# Patient Record
Sex: Male | Born: 1955 | Race: White | Hispanic: No | Marital: Single | Smoking: Never smoker
Health system: Southern US, Community
[De-identification: ages and names within clinical notes are randomized; demographics above are authoritative.]

## PROBLEM LIST (undated history)

## (undated) DIAGNOSIS — E7800 Pure hypercholesterolemia, unspecified: Secondary | ICD-10-CM

## (undated) DIAGNOSIS — I10 Essential (primary) hypertension: Secondary | ICD-10-CM

## (undated) DIAGNOSIS — E119 Type 2 diabetes mellitus without complications: Secondary | ICD-10-CM

## (undated) HISTORY — DX: Type 2 diabetes mellitus without complications: E11.9

## (undated) HISTORY — DX: Pure hypercholesterolemia, unspecified: E78.00

## (undated) HISTORY — DX: Essential (primary) hypertension: I10

---
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
## 1991-09-07 HISTORY — PX: ROTATOR CUFF REPAIR: SHX139

## 2008-10-11 ENCOUNTER — Encounter: Admission: RE | Admit: 2008-10-11 | Discharge: 2008-10-11 | Payer: Self-pay | Admitting: Internal Medicine

## 2009-11-27 ENCOUNTER — Ambulatory Visit: Payer: Self-pay | Admitting: Family Medicine

## 2009-11-27 ENCOUNTER — Ambulatory Visit (HOSPITAL_COMMUNITY): Admission: RE | Admit: 2009-11-27 | Discharge: 2009-11-27 | Payer: Self-pay | Admitting: Family Medicine

## 2009-11-27 DIAGNOSIS — E119 Type 2 diabetes mellitus without complications: Secondary | ICD-10-CM | POA: Insufficient documentation

## 2009-11-27 DIAGNOSIS — R079 Chest pain, unspecified: Secondary | ICD-10-CM | POA: Insufficient documentation

## 2009-11-27 DIAGNOSIS — I10 Essential (primary) hypertension: Secondary | ICD-10-CM | POA: Insufficient documentation

## 2009-11-27 DIAGNOSIS — E7800 Pure hypercholesterolemia, unspecified: Secondary | ICD-10-CM | POA: Insufficient documentation

## 2009-11-27 DIAGNOSIS — I1A0 Resistant hypertension: Secondary | ICD-10-CM | POA: Insufficient documentation

## 2009-11-27 HISTORY — DX: Essential (primary) hypertension: I10

## 2009-11-27 HISTORY — DX: Type 2 diabetes mellitus without complications: E11.9

## 2009-11-27 HISTORY — DX: Pure hypercholesterolemia, unspecified: E78.00

## 2009-12-01 ENCOUNTER — Encounter: Payer: Self-pay | Admitting: Family Medicine

## 2009-12-01 ENCOUNTER — Ambulatory Visit: Payer: Self-pay | Admitting: Family Medicine

## 2009-12-01 LAB — CONVERTED CEMR LAB
Albumin/Creatinine Ratio, Urine, POC: >300
Albumin: 4.8 g/dL (ref 3.5–5.2)
BUN: 16 mg/dL (ref 6–23)
CO2: 24 meq/L (ref 19–32)
Cholesterol: 144 mg/dL (ref 0–200)
Creatinine,U: 200 mg/dL
Glucose, Bld: 93 mg/dL (ref 70–99)
HDL: 36 mg/dL — ABNORMAL LOW (ref >39)
Hgb A1c MFr Bld: 5.3 %
Microalbumin U total vol: 150 mg/L
Potassium: 3.7 meq/L (ref 3.5–5.3)
Sodium: 141 meq/L (ref 135–145)
Total Bilirubin: 0.4 mg/dL (ref 0.3–1.2)
Total Protein: 7.6 g/dL (ref 6.0–8.3)
Triglycerides: 164 mg/dL — ABNORMAL HIGH (ref <150)

## 2009-12-02 ENCOUNTER — Encounter: Payer: Self-pay | Admitting: Family Medicine

## 2009-12-17 ENCOUNTER — Encounter: Payer: Self-pay | Admitting: Family Medicine

## 2009-12-19 ENCOUNTER — Encounter: Payer: Self-pay | Admitting: Family Medicine

## 2010-06-18 ENCOUNTER — Telehealth: Payer: Self-pay | Admitting: Family Medicine

## 2010-06-29 ENCOUNTER — Telehealth: Payer: Self-pay | Admitting: Family Medicine

## 2010-06-30 ENCOUNTER — Ambulatory Visit: Payer: Self-pay | Admitting: Family Medicine

## 2010-07-08 ENCOUNTER — Ambulatory Visit: Payer: Self-pay | Admitting: Family Medicine

## 2010-07-08 DIAGNOSIS — T148XXA Other injury of unspecified body region, initial encounter: Secondary | ICD-10-CM | POA: Insufficient documentation

## 2010-10-08 NOTE — Miscellaneous (Signed)
Summary: Re: cardiology appointment  Clinical Lists Changes   called patient to notifiy about cardiology appointment that has been scheduled for May 10 at 11:45 AM. with Dr. Shirlee Latch of [facility]  . he would like to know results of recent labs . cell phone # [phone]. will forward message to MD. Theresia Lo RN  December 17, 2009 4:34 PM   Called patient. Spoke with himn about labs. Will also send letter. Bobby Rumpf  MD  December 19, 2009 1:56 PM

## 2010-10-08 NOTE — Assessment & Plan Note (Signed)
Summary: leg swelling,df   Vital Signs:  Patient profile:   55 year old male Height:      69.5 inches Weight:      235 pounds BMI:     34.33 Temp:     98.6 degrees F oral Pulse rate:   90 / minute BP sitting:   168 / 92  (left arm) Cuff size:   large  Vitals Entered By: Tessie Fass CMA (June 30, 2010 2:31 PM) CC: right leg edema and pain Is Patient Diabetic? Yes Pain Assessment Patient in pain? yes     Location: right leg Intensity: 6   Primary Care Provider:  Bobby Rumpf   CC:  right leg edema and pain.  History of Present Illness: 55 yo M:  Mr. Speigner presents with right lower extremity redness and swelling that has gotten worse over the past 5 days.  About 10 days ago he stated he was chasing his dog and hit his right lower extremity against a table and noticed some swelling just medial and inferior to the tibial tubercle.  The pain is currently a 5-6/10 and does not radiate anywhere else.  A few days after the injury he noticed his leg had increased swelling, redness, and pain so he tried icing the injury site.  He also has used some Tylenol pain cream, which he says helps for about an hour or so.  He denies any skin breakage, bleeding, or other discharge.  He states that some of the redness may be due to icing the area for an extended period of time.  Has never had anything like this before.  He complains of some persistent swelling in bilateral lower extremities that he says is normal for him.  The right leg seems more swollen than usual. The patient denies fevers, chills, nausea, vomiting, dizziness, chest pain, palpitations, shortness of breath, or abdominal pain.  Habits & Providers  Alcohol-Tobacco-Diet     Tobacco Status: never  Current Medications (verified): 1)  Aspirin 81 Mg Tbec (Aspirin) .... One Tab By Mouth Qday 2)  Vesicare 10 Mg Tabs (Solifenacin Succinate) .... 1/2 Tab Po Qday 3)  Glimepiride 2 Mg Tabs (Glimepiride) .... One Tab By Mouth Qday 4)   Amlodipine Besylate 10 Mg Tabs (Amlodipine Besylate) .... One Tab By Mouth Qday 5)  Lisinopril 40 Mg Tabs (Lisinopril) .... One Tab By Mouth Qday 6)  Metoprolol Tartrate 100 Mg Tabs (Metoprolol Tartrate) .... One Tab By Mouth Two Times A Day 7)  Actoplus Met 15-850 Mg Tabs (Pioglitazone Hcl-Metformin Hcl) .... One Tab By Mouth Two Times A Day 8)  Pravastatin Sodium 40 Mg Tabs (Pravastatin Sodium) .... One Tab By Mouth At Bedtime  Allergies (verified): No Known Drug Allergies PMH-FH-SH reviewed for relevance  Review of Systems      See HPI  Physical Exam  General:  Well-developed, well-nourished, in no acute distress; alert, appropriate and cooperative throughout examination. Vitals reviewed. Lungs:  Normal respiratory effort, chest expands symmetrically. Lungs are clear to auscultation, no crackles or wheezes. Heart:  RRR no murmurs. Msk:  Neg Homan's. Normal ankle ROM - right. Pulses:  2+ DP. Extremities:  Trace left pedal edema and trace right pedal edema.   Skin:  Right lower anterior leg with  ~ 6 inches length erythema, warmth. No draining lesions. Healing abrasion.    Impression & Recommendations:  Problem # 1:  CELLULITIS, LEG, RIGHT (ICD-682.6) Assessment New  No Red Flags. Not suspicious for DVT. Follow-up for recheck in 1  week if not impoving. His updated medication list for this problem includes:    Septra Ds 800-160 Mg Tabs (Sulfamethoxazole-trimethoprim) .Marland Kitchen... 2 by mouth twice daily x 14 days  Orders: [facility]- Est  Level 4 ([address]644)  Problem # 2:  HYPERTENSION (ICD-401.9) Assessment: Unchanged  Recheck at next visit. Use Tylenol over NSAIDs as needed for pain. His updated medication list for this problem includes:    Amlodipine Besylate 10 Mg Tabs (Amlodipine besylate) ..... One tab by mouth qday    Lisinopril 40 Mg Tabs (Lisinopril) ..... One tab by mouth qday    Metoprolol Tartrate 100 Mg Tabs (Metoprolol tartrate) ..... One tab by mouth two times a  day  Orders: [facility]- Est  Level 4 (03474)  Complete Medication List: 1)  Aspirin 81 Mg Tbec (Aspirin) .... One tab by mouth qday 2)  Vesicare 10 Mg Tabs (Solifenacin succinate) .... 1/2 tab po qday 3)  Glimepiride 2 Mg Tabs (Glimepiride) .... One tab by mouth qday 4)  Amlodipine Besylate 10 Mg Tabs (Amlodipine besylate) .... One tab by mouth qday 5)  Lisinopril 40 Mg Tabs (Lisinopril) .... One tab by mouth qday 6)  Metoprolol Tartrate 100 Mg Tabs (Metoprolol tartrate) .... One tab by mouth two times a day 7)  Actoplus Met 15-850 Mg Tabs (Pioglitazone hcl-metformin hcl) .... One tab by mouth two times a day 8)  Pravastatin Sodium 40 Mg Tabs (Pravastatin sodium) .... One tab by mouth at bedtime 9)  Septra Ds 800-160 Mg Tabs (Sulfamethoxazole-trimethoprim) .... 2 by mouth twice daily x 14 days  Patient Instructions: 1)  It was nice to meet you today! 2)  You have an infection of your skin. It does not look like a blood clot. 3)  Come back by the end of this week for a recheck. Prescriptions: SEPTRA DS 800-160 MG TABS (SULFAMETHOXAZOLE-TRIMETHOPRIM) 2 by mouth twice daily x 14 days  #1 qs x 0   Entered and Authorized by:   Helane Rima DO   Signed by:   Helane Rima DO on 06/30/2010   Method used:   Electronically to        [facility] [address] (retail)       [address]       [place], [place]  [address]       Ph: [phone] or [phone]       Fax: [phone]   RxID:   [id number]    Orders Added: 1)  [facility]- Est  Level 4 [[address]]

## 2010-10-08 NOTE — Progress Notes (Signed)
  Phone Note Call from Patient   Caller: Patient Call For: [phone] Summary of Call: C/o of rt leg swelling.  Need to be seen sooner than 11/14 which is providers next available clinic Initial call taken by: Abundio Miu,  June 29, 2010 2:09 PM  Follow-up for Phone Call        Please call pt back and offer him a [place] appt tomorrow morning or Wednesday. Follow-up by: Dennison Nancy RN,  June 29, 2010 2:11 PM

## 2010-10-08 NOTE — Assessment & Plan Note (Signed)
Summary: F/U  BP/CAREW NOT AVAIL/KH   Vital Signs:  Patient profile:   55 year old male Height:      69.5 inches Weight:      233 pounds BMI:     34.04 Temp:     98.1 degrees F oral Pulse rate:   77 / minute BP sitting:   145 / 79  (left arm) Cuff size:   large  Vitals Entered By: Tessie Fass CMA (July 08, 2010 8:40 AM) CC: F/U  Is Patient Diabetic? Yes   Primary Care Provider:  Bobby Rumpf   CC:  F/U .  History of Present Illness: 55 yo M:  1. Cellulits: Right lower leg. Hx of injury: Patient stated he was chasing his dog and hit his right lower extremity against a table and noticed some swelling just medial and inferior to the tibial tubercle. A few days after the injury he noticed his leg had increased swelling, redness, and pain so he tried icing the injury site.  He also has used some Tylenol pain cream, which he says helped for about an hour or so.  He denied any skin breakage, bleeding, or other discharge. He was treated with Septra for cellulitis. He had no RED FLAGs last week. Today, he presents for follow-up of his right cellulitis. The patient denies fevers, chills, nausea, vomiting, dizziness, chest pain, palpitations, shortness of breath, or abdominal pain.  Current Medications (verified): 1)  Aspirin 81 Mg Tbec (Aspirin) .... One Tab By Mouth Qday 2)  Vesicare 10 Mg Tabs (Solifenacin Succinate) .... 1/2 Tab Po Qday 3)  Glimepiride 2 Mg Tabs (Glimepiride) .... One Tab By Mouth Qday 4)  Amlodipine Besylate 10 Mg Tabs (Amlodipine Besylate) .... One Tab By Mouth Qday 5)  Lisinopril 40 Mg Tabs (Lisinopril) .... One Tab By Mouth Qday 6)  Metoprolol Tartrate 100 Mg Tabs (Metoprolol Tartrate) .... One Tab By Mouth Two Times A Day 7)  Actoplus Met 15-850 Mg Tabs (Pioglitazone Hcl-Metformin Hcl) .... One Tab By Mouth Two Times A Day 8)  Pravastatin Sodium 40 Mg Tabs (Pravastatin Sodium) .... One Tab By Mouth At Bedtime 9)  Septra Ds 800-160 Mg Tabs  (Sulfamethoxazole-Trimethoprim) .... 2 By Mouth Twice Daily X 14 Days  Allergies (verified): No Known Drug Allergies PMH-FH-SH reviewed for relevance  Review of Systems      See HPI  Physical Exam  General:  Well-developed, well-nourished, in no acute distress; alert, appropriate and cooperative throughout examination. Vitals reviewed. Pulses:  2+ DP. Extremities:  Trace left pedal edema and trace right pedal edema.   Skin:  Right lower anterior leg with improved,  ~ 4 inches length erythema, with no warmth. No draining lesions. 2-3 cm hematoma.    Impression & Recommendations:  Problem # 1:  CELLULITIS, LEG, RIGHT (ICD-682.6) Assessment Improved  No Red Flags. Finish Abx. Precautions given. His updated medication list for this problem includes:    Septra Ds 800-160 Mg Tabs (Sulfamethoxazole-trimethoprim) .Marland Kitchen... 2 by mouth twice daily x 14 days  Orders: [facility]- Est  Level 4 ([address])  Problem # 2:  HEMATOMA (ICD-924.9) Assessment: New  Residual hematoma from injury - resolving. May apply ice three times a day for 10 minutes. No s/s DVT. Follow up if not improving in 1 week.  Orders: [facility]- Est  Level 4 ([address]454)  Problem # 3:  HYPERTENSION (ICD-401.9) Assessment: Improved  His updated medication list for this problem includes:    Amlodipine Besylate 10 Mg Tabs (Amlodipine besylate) .Marland KitchenMarland KitchenMarland KitchenMarland Kitchen  One tab by mouth qday    Lisinopril 40 Mg Tabs (Lisinopril) ..... One tab by mouth qday    Metoprolol Tartrate 100 Mg Tabs (Metoprolol tartrate) ..... One tab by mouth two times a day  Orders: [facility]- Est  Level 4 ([address]40)  Complete Medication List: 1)  Aspirin 81 Mg Tbec (Aspirin) .... One tab by mouth qday 2)  Vesicare 10 Mg Tabs (Solifenacin succinate) .... 1/2 tab po qday 3)  Glimepiride 2 Mg Tabs (Glimepiride) .... One tab by mouth qday 4)  Amlodipine Besylate 10 Mg Tabs (Amlodipine besylate) .... One tab by mouth qday 5)  Lisinopril 40 Mg Tabs (Lisinopril) .... One tab by mouth qday 6)   Metoprolol Tartrate 100 Mg Tabs (Metoprolol tartrate) .... One tab by mouth two times a day 7)  Actoplus Met 15-850 Mg Tabs (Pioglitazone hcl-metformin hcl) .... One tab by mouth two times a day 8)  Pravastatin Sodium 40 Mg Tabs (Pravastatin sodium) .... One tab by mouth at bedtime 9)  Septra Ds 800-160 Mg Tabs (Sulfamethoxazole-trimethoprim) .... 2 by mouth twice daily x 14 days  Patient Instructions: 1)  It was nice to see you today! 2)  Come back by the end of this week for a recheck.   Orders Added: 1)  [facility]- Est  Level 4 [98119]

## 2010-10-08 NOTE — Assessment & Plan Note (Signed)
Summary: NP,tcb   Vital Signs:  Patient profile:   55 year old male Height:      69.5 inches Weight:      237 pounds BMI:     34.62 BSA:     2.23 Temp:     98.3 degrees F Pulse rate:   77 / minute BP sitting:   173 / 90  Vitals Entered By: Jone Baseman CMA (November 27, 2009 4:06 PM) CC: NEW PATIENT Is Patient Diabetic? No Pain Assessment Patient in pain? no        Primary Care Provider:  Bobby Rumpf   CC:  NEW PATIENT.  History of Present Illness: 55 year old male w/ history of diabetes type 2, HTN, HLD here as a NEW PATIENT.  Previous doctor = Dr. Felipa Eth at [facility]   1) DM2: Last A1C 6.7 one year ago. Fasting CBGs 90-100's. Denies vision change, polyuria, polydispsia, LE neuropathy or ulceration. On medications as below w/o side effects. Walks daily for exercise 30 minutes. Has never had eye exam. Does not check feet daily.   2) HTN: Reports LE edema after work, reports single episode of chest pain as below. Reports that he is taking all medications as below w/o side effects.   3) Chest pain: Patient with single episode of left sided chest pain two weeks ago - felt like someone punched him in the chest, while he was working in yard. Denies nausea, radiation of pain, dizziness, diaphoresis. Pain improved after about 10 minutes with resting. Has never had pain like this before. No episodes since with exertion. Family history of early MI in 1st degree relative (brother with MI at age 37, has "12 stents").    Habits & Providers  Alcohol-Tobacco-Diet     Tobacco Status: never  Current Medications (verified): 1)  Aspirin 81 Mg Tbec (Aspirin) .... One Tab By Mouth Qday 2)  Vesicare 10 Mg Tabs (Solifenacin Succinate) .... 1/2 Tab Po Qday 3)  Glimepiride 2 Mg Tabs (Glimepiride) .... One Tab By Mouth Qday 4)  Amlodipine Besylate 10 Mg Tabs (Amlodipine Besylate) .... One Tab By Mouth Qday 5)  Lisinopril 40 Mg Tabs (Lisinopril) .... One Tab By Mouth Qday 6)   Metoprolol Tartrate 100 Mg Tabs (Metoprolol Tartrate) .... One Tab By Mouth Two Times A Day 7)  Actoplus Met 15-850 Mg Tabs (Pioglitazone Hcl-Metformin Hcl) .... One Tab By Mouth Two Times A Day 8)  Pravastatin Sodium 40 Mg Tabs (Pravastatin Sodium) .... One Tab By Mouth At Bedtime  Allergies (verified): No Known Drug Allergies  Past History:  Past Medical History: DM2 HTN HLD Arthritis   Past Surgical History: R hernia repair 1963 L rotator cuff repair [address])  Family History: Dad - unknown Mom - DM2, HTN Brother (only sibling) - MI at age 9, CAD, HTN   Social History: Lives with mom and dog. Denies tobacco, alcohol, drugs. Smoking Status:  never  Physical Exam  General:  obese male NAD  Eyes:  PERRL, EOMI, normal fundi.  Neck:  no carotid bruits  Chest Wall:  non tender to palpation  Lungs:  CTAB w/o wheeze or crackles  Heart:  RRR no murmurs  Abdomen:  obese, non tender, no abdominal pulsatile mass, no abdominal mass, +BS  Pulses:  2+ radials  Extremities:  no LE edema  Neurologic:  alert & oriented X3, cranial nerves II-XII intact, strength normal in all extremities, sensation intact to light touch, sensation intact to pinprick, and DTRs symmetrical and  normal.    Diabetes Management Exam:    Foot Exam (with socks and/or shoes not present):       Sensory-Pinprick/Light touch:          Left medial foot (L-4): normal          Left dorsal foot (L-5): normal          Left lateral foot (S-1): normal          Right medial foot (L-4): normal          Right dorsal foot (L-5): normal          Right lateral foot (S-1): normal       Sensory-Monofilament:          Left foot: normal          Right foot: normal       Inspection:          Left foot: normal          Right foot: normal       Nails:          Left foot: normal          Right foot: normal   Impression & Recommendations:  Problem # 1:  HYPERTENSION (ICD-401.9) Assessment Unchanged Not at goal.  130/80. Will check BMET, likely start HCTZ pending results. DASH diet, exercise discussed. Will call patient to discuss results and starting HCTZ.    His updated medication list for this problem includes:    Amlodipine Besylate 10 Mg Tabs (Amlodipine besylate) ..... One tab by mouth qday    Lisinopril 40 Mg Tabs (Lisinopril) ..... One tab by mouth qday    Metoprolol Tartrate 100 Mg Tabs (Metoprolol tartrate) ..... One tab by mouth two times a day  BP today: 173/90  Problem # 2:  HYPERCHOLESTEROLEMIA (ICD-272.0) Assessment: Unchanged Will check lipid panel. Continue statin.   His updated medication list for this problem includes:    Pravastatin Sodium 40 Mg Tabs (Pravastatin sodium) ..... One tab by mouth at bedtime  Future Orders: Lipid-[facility] ([id number]) ... 11/26/2010  Problem # 3:  DIABETES MELLITUS, TYPE II (ICD-250.00) Assessment: Unchanged Reports last A1C 6.7 one year ago. Will check A1C CMET urine microalbumin. Cotninue medications as below. Treat HTN as above. Check lipids. Follow up 3 months. DASH diet, exercise discussed as above.   His updated medication list for this problem includes:    Aspirin 81 Mg Tbec (Aspirin) ..... One tab by mouth qday    Glimepiride 2 Mg Tabs (Glimepiride) ..... One tab by mouth qday    Lisinopril 40 Mg Tabs (Lisinopril) ..... One tab by mouth qday    Actoplus Met 15-850 Mg Tabs (Pioglitazone hcl-metformin hcl) ..... One tab by mouth two times a day  Future Orders: Comp Met-[facility] ([id number]) ... 11/28/2010 UA Microalbumin-[facility] ([id number]) ... 11/28/2010 A1C-[facility] ([address]86) ... 11/28/2010  Problem # 4:  CHEST PAIN, ATYPICAL (ICD-786.59) Assessment: New Atypical location though brough on by exertion and relieved by rest. No episodes since. Will refer to cardiology for stress testing. EKG w/ NSR, normal intervals, non specific T wave changes.   Orders: 12 Lead EKG (12 Lead EKG)  Complete Medication List: 1)  Aspirin 81 Mg Tbec (Aspirin) .... One  tab by mouth qday 2)  Vesicare 10 Mg Tabs (Solifenacin succinate) .... 1/2 tab po qday 3)  Glimepiride 2 Mg Tabs (Glimepiride) .... One tab by mouth qday 4)  Amlodipine Besylate 10 Mg Tabs (Amlodipine besylate) .... One tab by mouth qday  5)  Lisinopril 40 Mg Tabs (Lisinopril) .... One tab by mouth qday 6)  Metoprolol Tartrate 100 Mg Tabs (Metoprolol tartrate) .... One tab by mouth two times a day 7)  Actoplus Met 15-850 Mg Tabs (Pioglitazone hcl-metformin hcl) .... One tab by mouth two times a day 8)  Pravastatin Sodium 40 Mg Tabs (Pravastatin sodium) .... One tab by mouth at bedtime  Patient Instructions: 1)  I will refer you to cardiology given your chest pain and your risk factors. 2)  Continue to check your BPs and your blood sugars as before. 3)  Follow up in three months.  Appended Document: Orders Update    Clinical Lists Changes  Orders: Added new Referral order of Cardiology Referral (Cardiology) - Signed

## 2010-10-08 NOTE — Letter (Signed)
Summary: Results Follow-up Letter  [facility]  [address]   [place], [place] [address]   Phone: [phone]  Fax: [phone]    12/19/2009  [address] [place], [place]  [address]  Dear Mr. Scism,   The following are the results of your recent test(s):  A1C = 5.3  Your goal is less than: 7  ____________________  LDL(Bad cholesterol): 75          Your goal is less than: 70    ___________________________     HDL (Good cholesterol): 36       Your goal is more than: 40 _________________________________________________________  Your tests of liver function and kidney function were within normal limits    Sincerely,  Bobby Rumpf  MD [facility]

## 2010-10-08 NOTE — Progress Notes (Signed)
  Phone Note Refill Request   Refills Requested: Medication #1:  LISINOPRIL 40 MG TABS one tab by mouth qday Pt received incorrect rx that was requested.  He received the one for the Metoprolol instead.  He need the Lisinopril refilled.  Please fax rx to [facility] pharmacy on [address]  [phone]  Initial call taken by: Abundio Miu,  June 18, 2010 9:20 AM    Prescriptions: LISINOPRIL 40 MG TABS (LISINOPRIL) one tab by mouth qday  #30 x 3   Entered and Authorized by:   Bobby Rumpf  MD   Signed by:   Bobby Rumpf  MD on 06/18/2010   Method used:   Electronically to        [facility] [address] (retail)       [address]       [place], [place]  [address]       Ph: [phone] or [phone]       Fax: [phone]   RxID:   [id number]  Please let know that script is at pharmacy for pickup. Thanks! Lavetta Nielsen  MD  June 18, 2010 9:59 AM   Appended Document:  Notified rx at pharmacy.

## 2010-10-14 ENCOUNTER — Other Ambulatory Visit: Payer: Self-pay | Admitting: Family Medicine

## 2010-10-14 NOTE — Telephone Encounter (Signed)
Please review and refill

## 2010-10-15 ENCOUNTER — Telehealth: Payer: Self-pay | Admitting: Family Medicine

## 2010-10-15 NOTE — Telephone Encounter (Signed)
Patient notified that his medication has been sent to pharmacy.  He will call for appointment next week for March.

## 2010-11-05 ENCOUNTER — Other Ambulatory Visit: Payer: Self-pay | Admitting: Family Medicine

## 2010-11-05 NOTE — Telephone Encounter (Signed)
Refill request

## 2010-11-14 ENCOUNTER — Other Ambulatory Visit: Payer: Self-pay | Admitting: Family Medicine

## 2010-11-15 NOTE — Telephone Encounter (Signed)
Refill request

## 2010-12-09 ENCOUNTER — Encounter: Payer: Self-pay | Admitting: Family Medicine

## 2010-12-09 ENCOUNTER — Ambulatory Visit (INDEPENDENT_AMBULATORY_CARE_PROVIDER_SITE_OTHER): Payer: 59 | Admitting: Family Medicine

## 2010-12-09 VITALS — BP 162/90 | HR 81 | Temp 98.2°F | Wt 229.0 lb

## 2010-12-09 DIAGNOSIS — I10 Essential (primary) hypertension: Secondary | ICD-10-CM

## 2010-12-09 DIAGNOSIS — E119 Type 2 diabetes mellitus without complications: Secondary | ICD-10-CM

## 2010-12-09 DIAGNOSIS — M436 Torticollis: Secondary | ICD-10-CM

## 2010-12-09 MED ORDER — CYCLOBENZAPRINE HCL 5 MG PO TABS: 5.0000 mg | ORAL_TABLET | Freq: Three times a day (TID) | ORAL | Status: AC | PRN

## 2010-12-09 MED ORDER — HYDROCHLOROTHIAZIDE 12.5 MG PO TABS: 12.5000 mg | ORAL_TABLET | Freq: Every day | ORAL | Status: DC

## 2010-12-09 NOTE — Assessment & Plan Note (Signed)
Start Flexeril 5mg  as below. Advised regarding stretching exercises. Follow up 1 month.

## 2010-12-09 NOTE — Patient Instructions (Signed)
Take flexeril for muscle spasm Take hydrochlorothiazide to help with blood pressure Follow up with me in one month so we can see how your pressure is doing. Have a great day!  - Dr. Wallene Huh

## 2010-12-09 NOTE — Progress Notes (Signed)
  Subjective:    Patient ID: Jeffrey Stanton, male    DOB: [birth year], 55 y.o.   MRN: [id number]  HPI  1) DM2: Last A1C 5.3 one year ago. Fasting CBGs 80's - 90's. Denies vision change, polyuria, polydispsia, LE neuropathy or ulceration. On medications as below w/o side effects. Walks daily for exercise 30 minutes. Has never had eye exam. Does not check feet daily.   2) HTN: BP not at goal today. 140 -150's systolic BP at home.  Reports that he is taking all medications as below w/o side effects. Denies LE edema, chest pain, dyspnea, orthopnea. Was scheduled for treadmill test with Adolph Pollack but did not keep appointments and was told that he could not reschedule.   3) Obesity: Walks with dog 2-4 times per day. Weight down 8 lbs in one year.   4) Muscle Spasm: Reports muscle tightness and pain in neck right > left, bilateral shoulders. No inciting injury. Has not taken anything for pain. Worse with turning neck, trying to look up.  Review of Systems As per HPI otherwise negative     Objective:   Physical Exam General:  obese male NAD  Eyes:  PERRL, EOMI, normal fundi.  Neck:  no carotid bruits  Chest Wall:  non tender to palpation  Lungs:  CTAB w/o wheeze or crackles  Heart:  RRR no murmurs  Abdomen:  obese, non tender, no abdominal pulsatile mass, no abdominal mass, +BS  Pulses:  2+ radials  Extremities:  no LE edema  Neurologic:  alert & oriented X3, cranial nerves II-XII intact, strength normal in all extremities, sensation intact to light touch, sensation intact to pinprick, and DTRs symmetrical and normal Musculoskeletal: Muscle spasm bilateral trapezius muscles, neck muscles; decreased range of motion secondary to pain with neck extension, rotation to right.        Assessment & Plan:

## 2010-12-09 NOTE — Assessment & Plan Note (Signed)
Not at goal 130 / 80. Will add HCTZ today. Advised regarding high fiber, low sodium. Follow up one month. No red flag symptoms.

## 2010-12-09 NOTE — Assessment & Plan Note (Signed)
At goal. Continue medications. Follow up one month for BP check - consider decrease medications. Reviewed high fiber diet.

## 2010-12-16 ENCOUNTER — Other Ambulatory Visit: Payer: Self-pay | Admitting: Family Medicine

## 2010-12-16 NOTE — Telephone Encounter (Signed)
Refill request

## 2011-01-06 ENCOUNTER — Encounter: Payer: Self-pay | Admitting: Family Medicine

## 2011-01-06 ENCOUNTER — Ambulatory Visit (INDEPENDENT_AMBULATORY_CARE_PROVIDER_SITE_OTHER): Payer: 59 | Admitting: Family Medicine

## 2011-01-06 VITALS — BP 153/88 | HR 76 | Temp 98.4°F | Ht 69.5 in | Wt 230.3 lb

## 2011-01-06 DIAGNOSIS — E7800 Pure hypercholesterolemia, unspecified: Secondary | ICD-10-CM

## 2011-01-06 DIAGNOSIS — I10 Essential (primary) hypertension: Secondary | ICD-10-CM

## 2011-01-06 LAB — COMPREHENSIVE METABOLIC PANEL
Albumin: 4.8 g/dL (ref 3.5–5.2)
BUN: 21 mg/dL (ref 6–23)
Calcium: 9.3 mg/dL (ref 8.4–10.5)
Chloride: 105 mEq/L (ref 96–112)
Glucose, Bld: 92 mg/dL (ref 70–99)
Potassium: 3.6 mEq/L (ref 3.5–5.3)

## 2011-01-06 LAB — LIPID PANEL
Cholesterol: 139 mg/dL (ref 0–200)
HDL: 38 mg/dL — ABNORMAL LOW (ref >39)
Total CHOL/HDL Ratio: 3.7 Ratio
Triglycerides: 156 mg/dL — ABNORMAL HIGH (ref <150)

## 2011-01-06 MED ORDER — HYDROCHLOROTHIAZIDE 12.5 MG PO TABS: 25.0000 mg | ORAL_TABLET | Freq: Every day | ORAL | Status: DC

## 2011-01-06 NOTE — Patient Instructions (Signed)
It was great to see you today! Follow up in three months. Review the low sodium and the high fiber diet handouts. Continue to increase your walking. Take two of the 12.5 mg hydrochlorothiazide pills until you run out then start the 25 mg pills (I have called these in)   - Dr. Wallene Huh

## 2011-01-07 NOTE — Assessment & Plan Note (Signed)
Nearer to goal 130 / 80. Will increase HCTZ today to 25 mg. Advised regarding high fiber, low sodium, exercise. Follow up 3 months. No red flag symptoms.

## 2011-01-07 NOTE — Progress Notes (Signed)
  Subjective:    Patient ID: Jeffrey Stanton, male    DOB: 01-31-1956, 55 y.o.   MRN: [id number]  HPI  1) HTN: BP not at goal today, but lower than last visit since started on hydrochlorothiazide in April 2012. BP now 130 -140systolic BP at home.  Reports that he is taking all medications as below w/o side effects. Denies LE edema, chest pain, dyspnea, orthopnea. Was scheduled for treadmill test with Adolph Pollack but did not keep appointments and was told that he could not reschedule. History of DM type 2. Walks with dog 2-4 times per day.   Reviewed pertinent past medical history   Review of Systems  As per HPI otherwise negative     Objective:   Physical Exam  General:  obese male NAD  Eyes:  PERRL, EOMI, normal fundi.  Neck:  no carotid bruits  Chest Wall:  non tender to palpation  Lungs:  CTAB w/o wheeze or crackles  Heart:  RRR no murmurs  Abdomen:  obese, non tender, no abdominal pulsatile mass, no abdominal mass, +BS  Pulses:  2+ radials  Extremities:  no LE edema       Assessment & Plan:

## 2011-01-28 ENCOUNTER — Other Ambulatory Visit: Payer: Self-pay | Admitting: Family Medicine

## 2011-01-28 NOTE — Telephone Encounter (Signed)
Refill request

## 2011-03-03 ENCOUNTER — Other Ambulatory Visit: Payer: Self-pay | Admitting: Family Medicine

## 2011-03-04 NOTE — Telephone Encounter (Signed)
Refill request

## 2011-04-19 ENCOUNTER — Other Ambulatory Visit: Payer: Self-pay | Admitting: Family Medicine

## 2011-04-20 NOTE — Telephone Encounter (Signed)
Refill request

## 2011-04-23 ENCOUNTER — Other Ambulatory Visit: Payer: Self-pay | Admitting: Family Medicine

## 2011-04-23 NOTE — Telephone Encounter (Signed)
Refill request

## 2011-06-16 ENCOUNTER — Ambulatory Visit (INDEPENDENT_AMBULATORY_CARE_PROVIDER_SITE_OTHER): Payer: 59 | Admitting: Family Medicine

## 2011-06-16 ENCOUNTER — Encounter: Payer: Self-pay | Admitting: Family Medicine

## 2011-06-16 VITALS — BP 150/100 | HR 78 | Temp 98.0°F | Wt 135.0 lb

## 2011-06-16 DIAGNOSIS — R1011 Right upper quadrant pain: Secondary | ICD-10-CM | POA: Insufficient documentation

## 2011-06-16 LAB — COMPREHENSIVE METABOLIC PANEL
Alkaline Phosphatase: 57 U/L (ref 39–117)
BUN: 25 mg/dL — ABNORMAL HIGH (ref 6–23)
Glucose, Bld: 90 mg/dL (ref 70–99)
Total Bilirubin: 0.4 mg/dL (ref 0.3–1.2)

## 2011-06-16 NOTE — Patient Instructions (Signed)
Mr. Kassebaum,  Thank you for coming in today. The cause of your pain is not 100% clear, but I am considering kidney stones, gallstones and musculoskeletal causes as high on my differentials. Please apply heat for about 20 mins a day 2x/day. Take motrin or tylenol as needed to see if that alleviates/prevents the pain.   Please f/u with me in 1 week If you pain is still present I will send you for an ultrasound to look for stones.   Red flags to prompt sooner return/you to go to ED: severe pain, unrelenting nausea and vomiting, fever (temp > 101.4).   -Dr. Armen Pickup

## 2011-06-19 ENCOUNTER — Other Ambulatory Visit: Payer: Self-pay | Admitting: Family Medicine

## 2011-06-20 NOTE — Telephone Encounter (Signed)
Refill request

## 2011-06-22 ENCOUNTER — Encounter: Payer: Self-pay | Admitting: Family Medicine

## 2011-06-22 NOTE — Assessment & Plan Note (Addendum)
A: colicky pain. Differentials included MSK pain vs. Biliary colic vs. Renal colic from passing stone. Have also considered RLL PNA but very unlikely given lack of cough and fever, and shingles but there is no rash.  P:  -CMP to eval bili and alk phos. -Heat and NSAIDs as MSK pain is very high on my list.  -F/u in 1 week is pain persist, sooner if needed, UA (collected but not processed b/c forgot to place order) and RUQ U/S to evaluate for gallstones.

## 2011-06-22 NOTE — Progress Notes (Signed)
  Subjective:    Patient ID: Jeffrey Stanton, male    DOB: Sep 23, 1955, 55 y.o.   MRN: [id number]  HPI 55 yo male with a PMHx significant for HLD, HTN and DM and a PSurgHX significant for appendectomy as a child presents with a 5 day history of sudden onset L side and flank pain. He states that the pain started while he was out walking the dogs. The pain occurs at rest and at activity. The pain is described as non-radiating and stabbing. 2/10 up to 6/10 in severity. It last a few seconds to a few minutes.  The pain  woke him from sleep last night. He denies developing rash or trauma to the area. He denies N/V/D/cough/fever. He denies symptoms onset with eating, dyuria, hematuria and urethral discharge.    Review of Systems 13 system ROS negative except as per HPI    Objective:   Physical Exam  Nursing note and vitals reviewed. Constitutional: He appears well-developed and well-nourished.  Eyes: Pupils are equal, round, and reactive to light. No scleral icterus.  Cardiovascular: Normal rate, regular rhythm and normal heart sounds.   Pulmonary/Chest: Effort normal and breath sounds normal.  Abdominal: Soft. Normal appearance and bowel sounds are normal. There is no hepatosplenomegaly. There is no rebound, no CVA tenderness, no tenderness at McBurney's point and negative Murphy's sign. No hernia.            Assessment & Plan:

## 2011-06-23 ENCOUNTER — Encounter: Payer: Self-pay | Admitting: Family Medicine

## 2011-06-23 ENCOUNTER — Ambulatory Visit (INDEPENDENT_AMBULATORY_CARE_PROVIDER_SITE_OTHER): Payer: 59 | Admitting: Family Medicine

## 2011-06-23 DIAGNOSIS — S39011A Strain of muscle, fascia and tendon of abdomen, initial encounter: Secondary | ICD-10-CM

## 2011-06-23 DIAGNOSIS — I10 Essential (primary) hypertension: Secondary | ICD-10-CM

## 2011-06-23 DIAGNOSIS — E119 Type 2 diabetes mellitus without complications: Secondary | ICD-10-CM

## 2011-06-23 DIAGNOSIS — R109 Unspecified abdominal pain: Secondary | ICD-10-CM

## 2011-06-23 MED ORDER — METFORMIN HCL 1000 MG PO TABS: 1000.0000 mg | ORAL_TABLET | Freq: Two times a day (BID) | ORAL | Status: DC

## 2011-06-23 MED ORDER — METOPROLOL TARTRATE 100 MG PO TABS: 150.0000 mg | ORAL_TABLET | Freq: Two times a day (BID) | ORAL | Status: DC

## 2011-06-23 NOTE — Assessment & Plan Note (Signed)
Last A1c was 5.1 Discuss medications with patient and the Actos is causing patient approximately $35 a month at this time we will discontinue this medication and will give metformin thousand milligrams daily we'll get an A1c in the next followup

## 2011-06-23 NOTE — Progress Notes (Signed)
  Subjective:    Patient ID: Jeffrey Stanton, male    DOB: 1956-04-05, 55 y.o.   MRN: [id number]  HPI 55 year old male coming in with followup of abdominal pain. Patient was recently seen by Dr. function is approximately one week ago at the time was diagnosed with likely muscle skeletal is draining. Patient states that this has improved over time it only hurts when he does certain movements as well as when he walks his dog and is on a leash and the ducts pulling otherwise does not notice it is able to do all activities of daily living and does think it's improving. Patient denies any type of fevers or chills denies any type of numbness in extremities denies any change in bowel habits and no association with food or urination   Diabetes:  High at home: Not checking regularly usually less than 100 Low at home: Usually less than 80 Taking medications: Yes Side effects: No ROS: denies fever, chills, dizziness, loss of conscieness, polyuria poly dipsia numbness or tingling in extremities or chest pain. Lab Results  Component Value Date   HGBA1C 5.1 12/09/2010    Hypertension Blood pressure at home: 160 systolic Blood pressure today: 175/89 Taking Meds: Yes Side effects: No ROS: Denies headache visual changes nausea, vomiting, chest pain or abdominal pain or shortness of breath.  The patient is also having some left-sided foot pain states it hurts more on the lateral S. back to the foot on the plantar aspect. Patient had the same problem the right side seems to be getting a little better with time but still not great patient denies a any open wounds on feet but does not see a podiatrist on a regular basis does usually check his feet nightly Review of Systems See above as stated in the history of present illness and Past medical surgical social and family history reviewed and no changes made    Objective:   Physical Exam General: No apparent distress Cardiovascularly regular rate and rhythm no  murmur Pulmonary clear to auscultation bilaterally Abdominal exam bowel sounds positive nontender patient does have some pain over the serious anterior muscle on the right side no true spasm felt no organomegaly no CVA tenderness Extremities: Patient's with fluid on plantar aspect has a callus formation occurring is minorly tender not infected no discharge    Assessment & Plan:

## 2011-06-23 NOTE — Assessment & Plan Note (Signed)
Appears to be getting better on its own we'll make no changes at this time patient can do Tylenol and ibuprofen as needed

## 2011-06-23 NOTE — Assessment & Plan Note (Signed)
Definitely not at goal at this time we will increase his metoprolol 250 mg twice a day patient's heart rate is still in the 70s we have some room. If he still needs better control we need to consider adding another medication either hydralazine versus clonidine

## 2011-06-23 NOTE — Patient Instructions (Signed)
Very nice to meet you. Your blood pressure is elevated we will increase her metoprolol to 1-1/2 pills twice a day I when she to stop the Actos/metformin medication I am giving you a new medication that is only metformin. If your foot still hurts you should come back in one month time With the changing of your blood pressure medicine and we may want to see you in one month to make sure you're doing better

## 2011-07-06 ENCOUNTER — Other Ambulatory Visit: Payer: Self-pay | Admitting: Family Medicine

## 2011-07-06 NOTE — Telephone Encounter (Signed)
Refill request

## 2011-07-26 ENCOUNTER — Encounter: Payer: Self-pay | Admitting: Family Medicine

## 2011-07-26 ENCOUNTER — Ambulatory Visit (INDEPENDENT_AMBULATORY_CARE_PROVIDER_SITE_OTHER): Payer: 59 | Admitting: Family Medicine

## 2011-07-26 VITALS — BP 167/84 | HR 73 | Temp 97.9°F | Resp 16

## 2011-07-26 DIAGNOSIS — I10 Essential (primary) hypertension: Secondary | ICD-10-CM

## 2011-07-26 DIAGNOSIS — Z23 Encounter for immunization: Secondary | ICD-10-CM

## 2011-07-26 DIAGNOSIS — E119 Type 2 diabetes mellitus without complications: Secondary | ICD-10-CM

## 2011-07-26 DIAGNOSIS — M79672 Pain in left foot: Secondary | ICD-10-CM | POA: Insufficient documentation

## 2011-07-26 DIAGNOSIS — M79609 Pain in unspecified limb: Secondary | ICD-10-CM

## 2011-07-26 LAB — POCT GLYCOSYLATED HEMOGLOBIN (HGB A1C): Hemoglobin A1C: 5.4

## 2011-07-26 MED ORDER — METOPROLOL TARTRATE 100 MG PO TABS: 200.0000 mg | ORAL_TABLET | Freq: Two times a day (BID) | ORAL | Status: DC

## 2011-07-26 MED ORDER — HYDROCHLOROTHIAZIDE 12.5 MG PO TABS: 12.5000 mg | ORAL_TABLET | Freq: Every day | ORAL | Status: DC

## 2011-07-26 NOTE — Assessment & Plan Note (Signed)
Patient is still at goal doing remarkably well. Patient was stopped off his Actos at last visit. We'll continue the metformin and Amaryl and hope he continues to stay for hemoglobin less than 6.0

## 2011-07-26 NOTE — Patient Instructions (Signed)
Good to see you Look at your feet daily.  You can try Dr. Rodolph Bong insoles or Spinco orthotics and you can find these at [employer] For your blood pressure.  Start taking the HCTZ at one pill  Increase your metoprolol to 200mg  twice a day.  Call me in 2 weeks and tell me the range of your blood pressure I wan to see you again in 3 months or come back if your foot is bothering you.

## 2011-07-26 NOTE — Assessment & Plan Note (Signed)
Still not at goal at this time very difficult to control. Patient was not taking his hydrochlorothiazide do to polyuria. We told him to decrease it to 12.5 at least daily and see if he can tolerate that. In addition we increased his metoprolol up to 200 mg twice a day to see if we have improvement in symptoms. Patient will return in one month or at least call or fax in results of him monitoring his blood pressure. If not seen before and will come back in 3 months time

## 2011-07-26 NOTE — Progress Notes (Signed)
  Subjective:    Patient ID: Jeffrey Stanton, male    DOB: 08/10/1956, 55 y.o.   MRN: [id number]  HPI 1. Hypertension Blood pressure at home:140-170 systolic Blood pressure today: 167/84 Taking Meds:yes Side effects:no ROS: Denies headache visual changes nausea, vomiting, chest pain or abdominal pain or shortness of breath.   Diabetes:  High at home:120 Low at home:90 Taking medications:yes Side effects:no ROS: denies fever, chills, dizziness, loss of conscieness, polyuria poly dipsia numbness or tingling in extremities or chest pain. Patient does state he's not following his diet as regularly as he should.    foot pain. Patient was seen for this last time as well has a left-sided foot pain where on the lateral aspect of his foot he does have a area where he has some rough skin changes it doesn't hurt after walking long distances. Patient was given a arch strap side last visit seems to help and he can walk further but still about 4-5 hours he starts having pain over this area. The pain is on the plantar aspect of his foot underneath the fifth metatarsal joint.  Review of Systems As stated above    Objective:   Physical Exam General: No apparent distress Cardiovascularly regular rate and rhythm no murmur Pulmonary clear to auscultation bilaterally Abdominal exam bowel sounds positive nontender patient does have some pain over the serious anterior muscle on the right side no true spasm felt no organomegaly no CVA tenderness Extremities: Patient's with fluid on plantar aspect has a callus more of a cutaneous horn formation occurring is minorly tender not infected no discharge After verbal and written consent patient did have liquid nitrogen freezing to this cutaneous horn patient is going to watch her very closely is going to take some toenail clippers and monitor for any type of changes in erythema discharge or increase in pain.    Assessment & Plan:

## 2011-07-26 NOTE — Assessment & Plan Note (Signed)
Secondary to a cutaneous form and following of the transverse arch. Patient will continue the arch support straps bilaterally and given metatarsal pads to help. Patient given the name of certain over-the-counter orthotics to attempt to try as well. Patient will do daily foot checks hopefully this will improve. If not patient will return in one month for further evaluation

## 2011-08-05 ENCOUNTER — Other Ambulatory Visit: Payer: Self-pay | Admitting: Family Medicine

## 2011-08-05 NOTE — Telephone Encounter (Signed)
Refill request

## 2011-08-09 MED ORDER — METOPROLOL TARTRATE 100 MG PO TABS: 200.0000 mg | ORAL_TABLET | Freq: Two times a day (BID) | ORAL | Status: DC

## 2011-09-02 ENCOUNTER — Other Ambulatory Visit: Payer: Self-pay | Admitting: Family Medicine

## 2011-09-03 NOTE — Telephone Encounter (Signed)
Refill request

## 2011-09-16 ENCOUNTER — Other Ambulatory Visit: Payer: Self-pay | Admitting: Family Medicine

## 2011-09-17 NOTE — Telephone Encounter (Signed)
Refill request

## 2011-10-07 ENCOUNTER — Other Ambulatory Visit: Payer: Self-pay | Admitting: Family Medicine

## 2011-10-07 NOTE — Telephone Encounter (Signed)
Refill request

## 2012-01-03 ENCOUNTER — Other Ambulatory Visit: Payer: Self-pay | Admitting: Family Medicine

## 2012-02-08 ENCOUNTER — Other Ambulatory Visit: Payer: Self-pay | Admitting: *Deleted

## 2012-02-08 MED ORDER — PRAVASTATIN SODIUM 40 MG PO TABS: 40.0000 mg | ORAL_TABLET | Freq: Every day | ORAL | Status: DC

## 2012-03-06 ENCOUNTER — Telehealth: Payer: Self-pay | Admitting: Family Medicine

## 2012-03-06 MED ORDER — OXYBUTYNIN CHLORIDE ER 10 MG PO TB24: 10.0000 mg | ORAL_TABLET | Freq: Every day | ORAL | Status: DC

## 2012-03-06 NOTE — Telephone Encounter (Signed)
Insurance is changing the price on one of his medications so they have given him names of a few that will be covered at the lower price.  Needs to replace Vesacare with Oxydutymin ER, Tospium, [place]. He only has 3 of the [facility] which will not last him through the week.

## 2012-04-21 ENCOUNTER — Other Ambulatory Visit: Payer: Self-pay | Admitting: Family Medicine

## 2012-04-26 ENCOUNTER — Other Ambulatory Visit: Payer: Self-pay | Admitting: *Deleted

## 2012-04-27 MED ORDER — AMLODIPINE BESYLATE 10 MG PO TABS: 10.0000 mg | ORAL_TABLET | Freq: Every day | ORAL | Status: DC

## 2012-05-08 ENCOUNTER — Other Ambulatory Visit: Payer: Self-pay | Admitting: Family Medicine

## 2012-05-09 ENCOUNTER — Other Ambulatory Visit: Payer: Self-pay | Admitting: *Deleted

## 2012-05-09 MED ORDER — METOPROLOL TARTRATE 100 MG PO TABS: 200.0000 mg | ORAL_TABLET | Freq: Two times a day (BID) | ORAL | Status: DC

## 2012-05-15 ENCOUNTER — Telehealth: Payer: Self-pay | Admitting: Family Medicine

## 2012-05-15 DIAGNOSIS — E7800 Pure hypercholesterolemia, unspecified: Secondary | ICD-10-CM

## 2012-05-15 MED ORDER — SIMVASTATIN 20 MG PO TABS: 20.0000 mg | ORAL_TABLET | Freq: Every day | ORAL | Status: DC

## 2012-05-15 NOTE — Telephone Encounter (Signed)
Pt went to pick up his Pravastatin and they increased the rpice to over $100.  The pharmacist told him to ask if he can change to Lovastatin or Simvastatin [facility]- [address]  Pt has an appt this Friday at 3 but he has been out since Friday.

## 2012-05-15 NOTE — Telephone Encounter (Signed)
Spoke with patient and told him that I will send message to Dr.  Konrad Dolores and will call him back when MD has responded.

## 2012-05-15 NOTE — Telephone Encounter (Signed)
Calling to say that pharmacy has increased price of pravastatin, and would like to have simvastatin or lovastatin. Will change to simva.

## 2012-05-15 NOTE — Telephone Encounter (Signed)
Patient notified

## 2012-05-15 NOTE — Assessment & Plan Note (Signed)
Pt calling and unable to afford Pravastatin. Will change to Simvastatin 20mg  Qday. Pt needs to come see in office for lab work

## 2012-05-17 ENCOUNTER — Other Ambulatory Visit: Payer: Self-pay | Admitting: Family Medicine

## 2012-05-19 ENCOUNTER — Encounter: Payer: Self-pay | Admitting: Family Medicine

## 2012-05-19 ENCOUNTER — Ambulatory Visit (INDEPENDENT_AMBULATORY_CARE_PROVIDER_SITE_OTHER): Payer: 59 | Admitting: Family Medicine

## 2012-05-19 ENCOUNTER — Other Ambulatory Visit: Payer: Self-pay | Admitting: Family Medicine

## 2012-05-19 VITALS — BP 169/82 | HR 73 | Temp 98.2°F | Ht 69.0 in | Wt 225.0 lb

## 2012-05-19 DIAGNOSIS — E7800 Pure hypercholesterolemia, unspecified: Secondary | ICD-10-CM

## 2012-05-19 DIAGNOSIS — E119 Type 2 diabetes mellitus without complications: Secondary | ICD-10-CM

## 2012-05-19 DIAGNOSIS — R197 Diarrhea, unspecified: Secondary | ICD-10-CM

## 2012-05-19 DIAGNOSIS — IMO0002 Reserved for concepts with insufficient information to code with codable children: Secondary | ICD-10-CM

## 2012-05-19 DIAGNOSIS — I10 Essential (primary) hypertension: Secondary | ICD-10-CM

## 2012-05-19 DIAGNOSIS — R229 Localized swelling, mass and lump, unspecified: Secondary | ICD-10-CM

## 2012-05-19 MED ORDER — LISINOPRIL 40 MG PO TABS: 40.0000 mg | ORAL_TABLET | Freq: Two times a day (BID) | ORAL | Status: DC

## 2012-05-19 MED ORDER — LOPERAMIDE HCL 2 MG PO CAPS: 2.0000 mg | ORAL_CAPSULE | Freq: Four times a day (QID) | ORAL | Status: AC | PRN

## 2012-05-19 NOTE — Patient Instructions (Addendum)
Thank you for coming into clinic today Please go to the lab to have your blood drawn Please increase your lisinopril to 40mg  twice a day Continue taking the imodium for your diarrhea and following the recommendations below. Also increase the amount of cheeses or yogurt in yoru diet Please come back in 2-4wks to check your blood pressure and diarrhea if you are still experiencing symptoms  Diarrhea Infections caused by germs (bacterial) or a virus commonly cause diarrhea. Your caregiver has determined that with time, rest and fluids, the diarrhea should improve. In general, eat normally while drinking more water than usual. Although water may prevent dehydration, it does not contain salt and minerals (electrolytes). Broths, weak tea without caffeine and oral rehydration solutions (ORS) replace fluids and electrolytes. Small amounts of fluids should be taken frequently. Large amounts at one time may not be tolerated. Plain water may be harmful in infants and the elderly. Oral rehydrating solutions (ORS) are available at pharmacies and grocery stores. ORS replace water and important electrolytes in proper proportions. Sports drinks are not as effective as ORS and may be harmful due to sugars worsening diarrhea.  ORS is especially recommended for use in children with diarrhea. As a general guideline for children, replace any new fluid losses from diarrhea and/or vomiting with ORS as follows:   If your child weighs 22 pounds or under (10 kg or less), give 60-120 mL ( -  cup or 2 - 4 ounces) of ORS for each episode of diarrheal stool or vomiting episode.   If your child weighs more than 22 pounds (more than 10 kgs), give 120-240 mL ( - 1 cup or 4 - 8 ounces) of ORS for each diarrheal stool or episode of vomiting.   While correcting for dehydration, children should eat normally. However, foods high in sugar should be avoided because this may worsen diarrhea. Large amounts of carbonated soft drinks,  juice, gelatin desserts and other highly sugared drinks should be avoided.   After correction of dehydration, other liquids that are appealing to the child may be added. Children should drink small amounts of fluids frequently and fluids should be increased as tolerated. Children should drink enough fluids to keep urine clear or pale yellow.   Adults should eat normally while drinking more fluids than usual. Drink small amounts of fluids frequently and increase as tolerated. Drink enough fluids to keep urine clear or pale yellow. Broths, weak decaffeinated tea, lemon lime soft drinks (allowed to go flat) and ORS replace fluids and electrolytes.   Avoid:   Carbonated drinks.   Juice.   Extremely hot or cold fluids.   Caffeine drinks.   Fatty, greasy foods.   Alcohol.   Tobacco.   Too much intake of anything at one time.   Gelatin desserts.   Probiotics are active cultures of beneficial bacteria. They may lessen the amount and number of diarrheal stools in adults. Probiotics can be found in yogurt with active cultures and in supplements.   Wash hands well to avoid spreading bacteria and virus.   Anti-diarrheal medications are not recommended for infants and children.   Only take over-the-counter or prescription medicines for pain, discomfort or fever as directed by your caregiver. Do not give aspirin to children because it may cause Reye's Syndrome.   For adults, ask your caregiver if you should continue all prescribed and over-the-counter medicines.   If your caregiver has given you a follow-up appointment, it is very important to keep that appointment. Not  keeping the appointment could result in a chronic or permanent injury, and disability. If there is any problem keeping the appointment, you must call back to this facility for assistance.  SEEK IMMEDIATE MEDICAL CARE IF:   You or your child is unable to keep fluids down or other symptoms or problems become worse in spite of  treatment.   Vomiting or diarrhea develops and becomes persistent.   There is vomiting of blood or bile (green material).   There is blood in the stool or the stools are black and tarry.   There is no urine output in 6-8 hours or there is only a small amount of very dark urine.   Abdominal pain develops, increases or localizes.   You have a fever.   Your baby is older than 3 months with a rectal temperature of 102 F (38.9 C) or higher.   Your baby is 18 months old or younger with a rectal temperature of 100.4 F (38 C) or higher.   You or your child develops excessive weakness, dizziness, fainting or extreme thirst.   You or your child develops a rash, stiff neck, severe headache or become irritable or sleepy and difficult to awaken.  MAKE SURE YOU:   Understand these instructions.   Will watch your condition.   Will get help right away if you are not doing well or get worse.  Document Released: 08/13/2002 Document Revised: 08/12/2011 Document Reviewed: 06/30/2009 [facility] Patient Information 2012 [place], [place].

## 2012-05-20 LAB — COMPREHENSIVE METABOLIC PANEL
ALT: 24 U/L (ref 0–53)
Albumin: 4.8 g/dL (ref 3.5–5.2)
Alkaline Phosphatase: 52 U/L (ref 39–117)
CO2: 29 mEq/L (ref 19–32)
Glucose, Bld: 87 mg/dL (ref 70–99)
Potassium: 3.5 mEq/L (ref 3.5–5.3)
Sodium: 140 mEq/L (ref 135–145)
Total Protein: 7.4 g/dL (ref 6.0–8.3)

## 2012-05-22 ENCOUNTER — Encounter: Payer: Self-pay | Admitting: Family Medicine

## 2012-05-22 ENCOUNTER — Other Ambulatory Visit: Payer: Self-pay | Admitting: *Deleted

## 2012-05-22 DIAGNOSIS — IMO0002 Reserved for concepts with insufficient information to code with codable children: Secondary | ICD-10-CM | POA: Insufficient documentation

## 2012-05-22 NOTE — Assessment & Plan Note (Signed)
Resolving large lump likely hematoma due to discoleration of skin. Pt w/ poor LE vasculature

## 2012-05-22 NOTE — Progress Notes (Signed)
  Subjective:    Patient ID: Jeffrey Stanton, male    DOB: 02/13/1956, 56 y.o.   MRN: [id number]  HPI CC: diarrhea  Diarrhea: started 2wks ago. Denies sick contacts or eating of spoiled food. Imodium w/ some relief. Occurs after 1 given meal in a day (breakfast, lunch, or dinner) followed by 2-3BM. BM are non-bloody liquid stool. Denies recent travel. Denies eating high fat or greasy foods. Denies fever, n/v/c, rash.   Bump on leg: Bump on leg over the past 2-3days. Non-painful and resolving. No recollection of trauma. Slight discoloration of skin along inferior aspect of bump  HTN: Compliant w/ prescribed Rx. BP typically 150/85. Denies HA, CP, SOB, syncope, Lightheadedness.  DM: BS typically low 100s at home. Denies any neuropathic type complaints. Denies any change in sensation, ab pain, vision change.   PMHx, PSHx, and Family Hx reviewed  Review of Systems Per HPI    Objective:   Physical Exam  Gen: NAD, obese Musc: normal ROM, no effusions Ext: 1+ pitting edema, 2+ pulses, slight 2cm round 1cm raised lump on medial aspect of L mid leg that is non-painful to palpation Skin: dermatitis type changes to skin of LE       Assessment & Plan:

## 2012-05-22 NOTE — Addendum Note (Signed)
Addended by: Konrad Dolores, Rula Ken[place] J on: 05/22/2012 02:17 PM   Modules accepted: Orders

## 2012-05-22 NOTE — Assessment & Plan Note (Signed)
A1c is excellent. Continue metformin. May DC if low again at next appt

## 2012-05-22 NOTE — Assessment & Plan Note (Signed)
Likely viral gastroenteritis. Resolving per pt. Imodium and foods high in bacterial load such as yogurt, cheese, and probiotics prn.

## 2012-05-22 NOTE — Assessment & Plan Note (Signed)
Lisinopril to increase to 40 BID as BP not at goal. Would like to decrease amlodipine if possible as likely contributor to LE edema

## 2012-05-23 ENCOUNTER — Other Ambulatory Visit: Payer: Self-pay | Admitting: *Deleted

## 2012-05-24 ENCOUNTER — Encounter: Payer: Self-pay | Admitting: Family Medicine

## 2012-05-24 MED ORDER — HYDROCHLOROTHIAZIDE 12.5 MG PO TABS: 12.5000 mg | ORAL_TABLET | Freq: Every day | ORAL | Status: DC

## 2012-06-07 ENCOUNTER — Other Ambulatory Visit: Payer: Self-pay | Admitting: *Deleted

## 2012-06-08 MED ORDER — GLIMEPIRIDE 2 MG PO TABS: 2.0000 mg | ORAL_TABLET | Freq: Every day | ORAL | Status: DC

## 2012-06-22 ENCOUNTER — Ambulatory Visit: Payer: 59 | Admitting: Family Medicine

## 2012-06-24 ENCOUNTER — Other Ambulatory Visit: Payer: Self-pay | Admitting: Family Medicine

## 2012-06-24 DIAGNOSIS — E119 Type 2 diabetes mellitus without complications: Secondary | ICD-10-CM

## 2012-06-29 NOTE — Telephone Encounter (Signed)
Patient has been waiting for the refill on his Metformin since last Saturday.  He has 3 left.  [facility] [address]  Patient would like a call when this has been taken care of.

## 2012-08-04 ENCOUNTER — Other Ambulatory Visit: Payer: Self-pay | Admitting: Family Medicine

## 2012-11-07 ENCOUNTER — Other Ambulatory Visit: Payer: Self-pay | Admitting: Family Medicine

## 2012-11-08 ENCOUNTER — Other Ambulatory Visit: Payer: Self-pay | Admitting: Family Medicine

## 2012-11-24 ENCOUNTER — Other Ambulatory Visit: Payer: Self-pay | Admitting: Family Medicine

## 2012-11-27 ENCOUNTER — Other Ambulatory Visit: Payer: Self-pay | Admitting: Family Medicine

## 2013-02-07 ENCOUNTER — Encounter: Payer: Self-pay | Admitting: Family Medicine

## 2013-02-07 ENCOUNTER — Ambulatory Visit (INDEPENDENT_AMBULATORY_CARE_PROVIDER_SITE_OTHER): Payer: BLUE CROSS/BLUE SHIELD | Admitting: Family Medicine

## 2013-02-07 VITALS — BP 167/91 | HR 89 | Ht 70.0 in | Wt 225.0 lb

## 2013-02-07 DIAGNOSIS — S7011XA Contusion of right thigh, initial encounter: Secondary | ICD-10-CM

## 2013-02-07 DIAGNOSIS — S7010XA Contusion of unspecified thigh, initial encounter: Secondary | ICD-10-CM

## 2013-02-07 NOTE — Progress Notes (Signed)
S: Pt comes in today for SDA for right hip pain.  Pt reports an injury 1.5 weeks ago.  He was out walking his dog, got pulled down by the dog into the road.  Got an abrasion on his right lower leg as well as on his hip that he noticed later that day.  His hip hurt for several days, is now burning/itching mass on his thigh.  Hurts with sitting, standing from sitting.  Can't lay on the right side because of pain.  Is able to walk ok.  Having a hard time putting sock and shoe on his right foot because the hip/thigh doesn't want to bend.  Is able to move his hip without pain, the pain is coming from the big lump on his thigh.  Reports that it is slowly improving, started out the size of a football.    Has tried ice, which seems to make it feel better the first few days, as well as tylenol.   He does have DM, but reports well controlled sugars of 95-105 on fasting checks.  He is not on blood thinners but does take a baby aspirin daily.  Denies fevers/chills or redness around the mass/swollen area other than the bruising.   ROS: Per HPI  History  Smoking status  . Never Smoker   Smokeless tobacco  . Never Used    O:  Filed Vitals:   02/07/13 0850  BP: 167/91  Pulse: 89    Gen: NAD Right leg:9x9cm hematoma of upper, lateral thigh surrounded by ecchymosis, one pocket appears to be soft/liquid w/o fluctuance but the rest is firm; no cellulitis changes; also with large, 12cm linear abrasion over his right shin and 4cm abrasion over lateral aspect of right knee- both appear to be healing well; 2+ pitting edema RLE, no edema LLE  Hip strength and leg strength is 5/5 bilaterally; full R hip ROM with minimal limitation due to pain from "skin stretching"    A/P: 57 y.o. male p/w large right thigh hematoma s/p fall -See problem list -f/u in 2 weeks if not continuing to improve

## 2013-02-07 NOTE — Assessment & Plan Note (Signed)
Per pt, is resolving, just slowly.  Does not appear to be infected, do not think that evacuating hematoma would be beneficial to pt.  Red flags such as infection discussed with pt.  F/u in 2 weeks if not improving.  Advised tylenol PRN pain/discomfort and continued activity.

## 2013-02-07 NOTE — Patient Instructions (Addendum)
It was nice to meet you today.  I'm sorry about the fall!  It just looks like a big bruise or good egg.  You can keep using ice if you want-- tylenol is fine for the pain.  Keep moving the leg to keep it from getting stiff.  Come back if the area is getting red, bigger, hurting more, or you start havign fevers.  Otherwise, come back to see Dr. Konrad Dolores in a couple of weeks to make sure it continues to get better.    Hematoma A hematoma is a pocket of blood that collects under the skin, in an organ, in a body space, in a joint space, or in other tissue. The blood can clot to form a lump that you can see and feel. The lump is often firm, sore, and sometimes even painful and tender. Most hematomas get better in a few days to weeks. However, some hematomas may be serious and require medical care.Hematomas can range in size from very small to very large. CAUSES  A hematoma can be caused by a blunt or penetrating injury. It can also be caused by leakage from a blood vessel under the skin. Spontaneous leakage from a blood vessel is more likely to occur in elderly people, especially those taking blood thinners. Sometimes, a hematoma can develop after certain medical procedures. SYMPTOMS  Unlike a bruise, a hematoma forms a firm lump that you can feel. This lump is the collection of blood. The collection of blood can also cause your skin to turn a blue to dark blue color. If the hematoma is close to the surface of the skin, it often produces a yellowish color in the skin. DIAGNOSIS  Your caregiver can determine whether you have a hematoma based on your history and a physical exam. TREATMENT  Hematomas usually go away on their own over time. Rarely does the blood need to be drained out of the body. HOME CARE INSTRUCTIONS   Put ice on the injured area.  Put ice in a plastic bag.  Place a towel between your skin and the bag.  Leave the ice on for 15-20 minutes, 3-4 times a day for the first 1 to 2  days.  After the first 2 days, switch to using warm compresses on the hematoma.  Elevate the injured area to help decrease pain and swelling. Wrapping the area with an elastic bandage may also be helpful. Compression helps to reduce swelling and promotes shrinking of the hematoma. Make sure the bandage is not wrapped too tight.  If your hematoma is on a lower extremity and is painful, crutches may be helpful for a couple days.  Only take over-the-counter or prescription medicines for pain, discomfort, or fever as directed by your caregiver. Most patients can take acetaminophen or ibuprofen for the pain. SEEK IMMEDIATE MEDICAL CARE IF:   You have increasing pain, or your pain is not controlled with medicine.  You have a fever.  You have worsening swelling or discoloration.  Your skin over the hematoma breaks or starts bleeding. MAKE SURE YOU:   Understand these instructions.  Will watch your condition.  Will get help right away if you are not doing well or get worse. Document Released: 04/06/2004 Document Revised: 11/15/2011 Document Reviewed: 04/26/2011 [facility] Patient Information 2014 [place], [place].

## 2013-03-15 ENCOUNTER — Other Ambulatory Visit: Payer: Self-pay | Admitting: Family Medicine

## 2013-04-18 ENCOUNTER — Other Ambulatory Visit: Payer: Self-pay | Admitting: Family Medicine

## 2013-05-14 ENCOUNTER — Other Ambulatory Visit: Payer: Self-pay | Admitting: Family Medicine

## 2013-05-28 ENCOUNTER — Other Ambulatory Visit: Payer: Self-pay | Admitting: Family Medicine

## 2013-05-29 NOTE — Telephone Encounter (Signed)
OK to refill for 1 mo but need to see in clinic

## 2013-05-29 NOTE — Telephone Encounter (Signed)
Pt is aware and states that he will make follow up when he has insurance.  Jazmin Hartsell,CMA

## 2013-06-08 ENCOUNTER — Other Ambulatory Visit: Payer: Self-pay | Admitting: Family Medicine

## 2013-06-12 ENCOUNTER — Other Ambulatory Visit: Payer: Self-pay | Admitting: Family Medicine

## 2013-06-25 ENCOUNTER — Other Ambulatory Visit: Payer: Self-pay | Admitting: Family Medicine

## 2013-06-27 ENCOUNTER — Other Ambulatory Visit: Payer: Self-pay | Admitting: Family Medicine

## 2013-06-30 ENCOUNTER — Other Ambulatory Visit: Payer: Self-pay | Admitting: Family Medicine

## 2013-06-30 NOTE — Telephone Encounter (Signed)
Will refill for 1 month but pt needs appt w/ me prior to next refill

## 2013-07-02 NOTE — Telephone Encounter (Signed)
Spoke with patient.  He will try to make appt if he can, but he has not had a job in almost a year, therefore he does not have insurance. Jeffrey Stanton, Jeffrey Stanton

## 2013-07-10 ENCOUNTER — Telehealth: Payer: Self-pay

## 2013-07-10 NOTE — Telephone Encounter (Signed)
Patient having difficulty affording medication. Would like to know if there are any other options until he is able to get health insurance (January 1st?) . Please call patient.

## 2013-07-10 NOTE — Telephone Encounter (Signed)
Discussed with Dr. Raymondo Band who informed me that 2 of the medications (metformin/glimepiride) are free at [facility] and the other medications should total about $20 for a month supply...  Theresia Bough, MSW, LCSW [phone]

## 2013-07-10 NOTE — Telephone Encounter (Signed)
To MD.  When I spoke to patient, he has lost his job and cant afford insurance to come to appt with MD. Milas Gain, Maryjo Rochester

## 2013-07-10 NOTE — Telephone Encounter (Signed)
Jeffrey Stanton,  Any help you could provide? MAP program or otherwise

## 2013-07-11 ENCOUNTER — Other Ambulatory Visit: Payer: Self-pay | Admitting: Family Medicine

## 2013-07-18 MED ORDER — SIMVASTATIN 20 MG PO TABS: ORAL_TABLET | ORAL | Status: DC

## 2013-07-18 MED ORDER — LISINOPRIL 40 MG PO TABS: ORAL_TABLET | ORAL | Status: DC

## 2013-07-18 MED ORDER — AMLODIPINE BESYLATE 10 MG PO TABS: ORAL_TABLET | ORAL | Status: DC

## 2013-07-18 MED ORDER — ASPIRIN 81 MG PO TABS: 81.0000 mg | ORAL_TABLET | Freq: Every day | ORAL | Status: DC

## 2013-07-18 MED ORDER — GLIMEPIRIDE 2 MG PO TABS: 2.0000 mg | ORAL_TABLET | Freq: Every day | ORAL | Status: DC

## 2013-07-18 MED ORDER — METOPROLOL TARTRATE 100 MG PO TABS: ORAL_TABLET | ORAL | Status: DC

## 2013-07-18 MED ORDER — METFORMIN HCL 1000 MG PO TABS: ORAL_TABLET | ORAL | Status: DC

## 2013-07-18 NOTE — Addendum Note (Signed)
Addended by: Konrad Dolores, Nitesh Pitstick J on: 07/18/2013 12:27 PM   Modules accepted: Orders, Medications

## 2013-07-18 NOTE — Telephone Encounter (Signed)
Will refill all medications as 90 days per pt request.  Rx sent to [facility].  Shelly Flatten, MD Family Medicine PGY-3 07/18/2013, 12:21 PM

## 2014-06-21 ENCOUNTER — Other Ambulatory Visit: Payer: Self-pay | Admitting: *Deleted

## 2014-06-21 MED ORDER — METOPROLOL TARTRATE 100 MG PO TABS: ORAL_TABLET | ORAL | Status: DC

## 2014-06-21 NOTE — Telephone Encounter (Signed)
Pt. Has not been seen in clinic for greater than 1 year. Will need to have yearly exam / checkup prior to giving longer refills. Will give a two month refill, but he needs to be seen in clinic.   Thanks,  Exxon Mobil CorporationCaleb Dajion Stanton

## 2014-06-24 NOTE — Telephone Encounter (Signed)
LM with wife that pt is "due for a checkup". Jeffrey Stanton, Jeffrey RochesterJessica Stanton

## 2014-06-26 NOTE — Telephone Encounter (Signed)
FYI : Pt calls, very adamant that he cannot afford to come in and see MD. 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 Advised patient that no MD will continue to prescribe meds without ever seeing him in clinic. He states he will take the meds he was prescribed for the 2 months and after that will stop taking it bc there is no way he can afford to be seen. Advised patient that he can be billed, and make payments or apply [facility].

## 2014-06-26 NOTE — Telephone Encounter (Signed)
Will forward message to MD so he is aware. Joey Hudock,CMA

## 2014-08-09 ENCOUNTER — Other Ambulatory Visit: Payer: Self-pay | Admitting: *Deleted

## 2014-08-09 DIAGNOSIS — E119 Type 2 diabetes mellitus without complications: Secondary | ICD-10-CM

## 2014-08-11 MED ORDER — GLIMEPIRIDE 2 MG PO TABS: 2.0000 mg | ORAL_TABLET | Freq: Every day | ORAL | Status: DC

## 2014-08-11 MED ORDER — METFORMIN HCL 1000 MG PO TABS: ORAL_TABLET | ORAL | Status: DC

## 2014-08-11 NOTE — Telephone Encounter (Signed)
Will refill for one month, but he must schedule an appointment for further refills. He hasn't been seen in clinic since 2014. Thanks  CGM

## 2014-08-12 NOTE — Telephone Encounter (Signed)
No answer and no machine.  Will mail letter. Jeffrey Stanton Jeffrey Stanton  

## 2014-08-12 NOTE — Telephone Encounter (Signed)
Pt informed of medication refill and to schedule an appt for follow up with PCP.  Pt stated he does not have any insurance at this time.  Pt was informed to speak with Mrs. Britta MccreedyBarbara, Artistfinancial counselor.  Pt agreed to speak with her.  Clovis PuMartin, Daylani Deblois L, RN

## 2014-09-06 ENCOUNTER — Other Ambulatory Visit: Payer: Self-pay | Admitting: Family Medicine

## 2014-09-30 ENCOUNTER — Telehealth: Payer: Self-pay | Admitting: Family Medicine

## 2014-09-30 NOTE — Telephone Encounter (Signed)
Received letter about scheduling an appt. Doesn't have insuranc. Is hoping to have it in Feb. Has been receiving refills for diabetic medication but hasnt received the refills on blood pressure med Pt cant understand the difference. Please advise

## 2014-10-01 NOTE — Telephone Encounter (Signed)
LMOVM for pt to return call.  Please inform of the below. Fleeger, Jessica Dawn  

## 2014-10-01 NOTE — Telephone Encounter (Signed)
Hasn't been in clinic since 2014. He will need to come in prior to authorizing any further refills. He needs an evaluation before being prescribed these medications. Thanks  CGM

## 2014-10-02 NOTE — Telephone Encounter (Signed)
Pt is aware and states that since he has been out of medication for couple of months, that he will just continue without it.  No insurance currently for an appt. Ezell Melikian,CMA

## 2014-10-28 ENCOUNTER — Encounter: Payer: Self-pay | Admitting: Family Medicine

## 2014-10-28 ENCOUNTER — Ambulatory Visit (INDEPENDENT_AMBULATORY_CARE_PROVIDER_SITE_OTHER): Payer: 59 | Admitting: Family Medicine

## 2014-10-28 VITALS — BP 214/119 | HR 83 | Temp 98.6°F | Ht 70.0 in | Wt 214.2 lb

## 2014-10-28 DIAGNOSIS — E7800 Pure hypercholesterolemia, unspecified: Secondary | ICD-10-CM

## 2014-10-28 DIAGNOSIS — Z1211 Encounter for screening for malignant neoplasm of colon: Secondary | ICD-10-CM

## 2014-10-28 DIAGNOSIS — E785 Hyperlipidemia, unspecified: Secondary | ICD-10-CM

## 2014-10-28 DIAGNOSIS — E118 Type 2 diabetes mellitus with unspecified complications: Secondary | ICD-10-CM

## 2014-10-28 DIAGNOSIS — I10 Essential (primary) hypertension: Secondary | ICD-10-CM

## 2014-10-28 DIAGNOSIS — F322 Major depressive disorder, single episode, severe without psychotic features: Secondary | ICD-10-CM | POA: Insufficient documentation

## 2014-10-28 DIAGNOSIS — R197 Diarrhea, unspecified: Secondary | ICD-10-CM

## 2014-10-28 LAB — CBC WITH DIFFERENTIAL/PLATELET
Basophils Absolute: 0 10*3/uL (ref 0.0–0.1)
Basophils Relative: 0 % (ref 0–1)
EOS ABS: 0.1 10*3/uL (ref 0.0–0.7)
Eosinophils Relative: 2 % (ref 0–5)
HCT: 42 % (ref 39.0–52.0)
HEMOGLOBIN: 14.5 g/dL (ref 13.0–17.0)
LYMPHS PCT: 31 % (ref 12–46)
Lymphs Abs: 2.1 10*3/uL (ref 0.7–4.0)
MCH: 28.5 pg (ref 26.0–34.0)
MCHC: 34.5 g/dL (ref 30.0–36.0)
MCV: 82.5 fL (ref 78.0–100.0)
MPV: 11.4 fL (ref 8.6–12.4)
Monocytes Absolute: 0.5 10*3/uL (ref 0.1–1.0)
Monocytes Relative: 7 % (ref 3–12)
NEUTROS ABS: 4.1 10*3/uL (ref 1.7–7.7)
NEUTROS PCT: 60 % (ref 43–77)
Platelets: 213 10*3/uL (ref 150–400)
RBC: 5.09 MIL/uL (ref 4.22–5.81)
RDW: 15.1 % (ref 11.5–15.5)
WBC: 6.8 10*3/uL (ref 4.0–10.5)

## 2014-10-28 LAB — COMPREHENSIVE METABOLIC PANEL
ALK PHOS: 69 U/L (ref 39–117)
ALT: 29 U/L (ref 0–53)
AST: 20 U/L (ref 0–37)
Albumin: 4.7 g/dL (ref 3.5–5.2)
BILIRUBIN TOTAL: 0.4 mg/dL (ref 0.2–1.2)
BUN: 15 mg/dL (ref 6–23)
CALCIUM: 9.9 mg/dL (ref 8.4–10.5)
CO2: 25 meq/L (ref 19–32)
CREATININE: 0.89 mg/dL (ref 0.50–1.35)
Chloride: 104 mEq/L (ref 96–112)
GLUCOSE: 82 mg/dL (ref 70–99)
Potassium: 3.6 mEq/L (ref 3.5–5.3)
Sodium: 141 mEq/L (ref 135–145)
Total Protein: 7.8 g/dL (ref 6.0–8.3)

## 2014-10-28 LAB — POCT GLYCOSYLATED HEMOGLOBIN (HGB A1C): Hemoglobin A1C: 5.5

## 2014-10-28 MED ORDER — AMLODIPINE BESYLATE 10 MG PO TABS: ORAL_TABLET | ORAL | Status: DC

## 2014-10-28 MED ORDER — HYDROCHLOROTHIAZIDE 12.5 MG PO TABS: 12.5000 mg | ORAL_TABLET | Freq: Every day | ORAL | Status: DC

## 2014-10-28 MED ORDER — LISINOPRIL 40 MG PO TABS: ORAL_TABLET | ORAL | Status: DC

## 2014-10-28 MED ORDER — ASPIRIN 81 MG PO TABS: 81.0000 mg | ORAL_TABLET | Freq: Every day | ORAL | Status: AC

## 2014-10-28 MED ORDER — METOPROLOL TARTRATE 100 MG PO TABS
ORAL_TABLET | ORAL | Status: DC
Start: 2014-10-28 — End: 2015-12-20

## 2014-10-28 NOTE — Assessment & Plan Note (Signed)
A: Pt. Here with markedly elevated blood pressure to 214/ 119 initially by machine. Rechecked myself manually and found to be 192 / 102. Still markedly elevated, though pt. Without symptoms at this time. Comprehensive review of systems without symptoms of end organ damage. No vision changes, no headache, no abdominal pain, no nausea, no vomiting, No focal neurological deficits. He was previously on 4 blood pressure medications, though possibly only 3 based on Dr. Satira SarkMerrell's note from 2013. At the least, he was on Metoprolol, Norvasc, and Lisinopril. He has not had these for some time. He denies chest pain, palpitations, SOB, neck pain, jaw pain, or arm pain. No diaphoresis. I discussed with him the danger of the elevation of his blood pressure, and that if his blood pressure remained uncontrolled, then there is a chance that he could even have a stroke, or have other end organ damage. I instructed him that if he experienced any acute changes in vision, became dizzy, had a headache, felt nauseated, had chest pain, shortness of breath, or for any other acute symptom that he should go to the ED for evaluation given the elevation in his blood pressure. He agreed to go to the pharmacy and get his blood pressure medicines including metoprolol and lisinopril and take the first dose tonight. He also agreed to return to the clinic for a blood pressure check in 2-3 days, and to follow up with a provider at our clinic in one week.   P:  - Refilled Metoprolol, Lisinopril to start now.  - Cancelled refill of Norvasc. Will consider restarting this in the future if his BP remains uncontrolled.  - Will call to follow up on pt. Tomorrow given that he was on high doses of lopressor and lisinopril previously.  - CMET taken at the clinic today. Will f/u results and refer pt. To the ED if evidence of end organ damage.  - Will need ongoing close follow up of HTN to prevent end organ damage including CHF given clear S4 on physical  exam, as well as prevention of kidney injury.

## 2014-10-28 NOTE — Assessment & Plan Note (Addendum)
A: Well controlled at this time on Amaryl and Metformin 1000mg  BID. Given chronic diarrhea, would like to clear the picture a bit. A1C is 5.4 and could tolerate reduction of Metformin dose to 1000mg  qam. We will continue to titrate to A1C goal and blood sugar goal.   P;  - Continue Metformin 1000mg  once daily.  - Continue Amaryl 2mg  daily for now.  - Needs foot exam and eye exam this year.  - Needs urine microalbumin at next visit - Good control. Continue to titrate meds to control.  - F/U CMET

## 2014-10-28 NOTE — Patient Instructions (Addendum)
Thanks for coming in today.   1. Schedule an appointment on your way out for follow up lab work to be done at your convenience over the next week. You will also need your blood pressure checked at that appointment.  2. Your medications have been refilled and sent to your pharmacy. Only start taking your metoprolol and Lisinopril whenever you pick up your medications. . Take one of your metformin tabs  each morning.  3. Please think further about getting a colonoscopy.  4. If you have any thoughts of hurting yourself or hurting anyone else, then please don't hesitate to call. We are here to help.    If you have any other questions or concerns, then don't hesitate to contact us. We will see you back in 3 months or sooner if something comes up.   Thanks for letting us take care of you.   Sincerely,  Devota Pace, MD Family Medicine - PGY 1  Food Choices to Help Relieve Diarrhea When you have diarrhea, the foods you eat and your eating habits are very important. Choosing the right foods and drinks can help relieve diarrhea. Also, because diarrhea can last up to 7 days, you need to replace lost fluids and electrolytes (such as sodium, potassium, and chloride) in order to help prevent dehydration.  WHAT GENERAL GUIDELINES DO I NEED TO FOLLOW?  Slowly drink 1 cup (8 oz) of fluid for each episode of diarrhea. If you are getting enough fluid, your urine will be clear or pale yellow.  Eat starchy foods. Some good choices include white rice, white toast, pasta, low-fiber cereal, baked potatoes (without the skin), saltine crackers, and bagels.  Avoid large servings of any cooked vegetables.  Limit fruit to two servings per day. A serving is  cup or 1 small piece.  Choose foods with less than 2 g of fiber per serving.  Limit fats to less than 8 tsp (38 g) per day.  Avoid fried foods.  Eat foods that have probiotics in them. Probiotics can be found in certain dairy products.  Avoid  foods and beverages that may increase the speed at which food moves through the stomach and intestines (gastrointestinal tract). Things to avoid include:  High-fiber foods, such as dried fruit, raw fruits and vegetables, nuts, seeds, and whole grain foods.  Spicy foods and high-fat foods.  Foods and beverages sweetened with high-fructose corn syrup, honey, or sugar alcohols such as xylitol, sorbitol, and mannitol. WHAT FOODS ARE RECOMMENDED? Grains White rice. White, [place], or pita breads (fresh or toasted), including plain rolls, buns, or bagels. White pasta. Saltine, soda, or graham crackers. Pretzels. Low-fiber cereal. Cooked cereals made with water (such as cornmeal, farina, or cream cereals). Plain muffins. Matzo. Melba toast. Zwieback.  Vegetables Potatoes (without the skin). Strained tomato and vegetable juices. Most well-cooked and canned vegetables without seeds. Tender lettuce. Fruits Cooked or canned applesauce, apricots, cherries, fruit cocktail, grapefruit, peaches, pears, or plums. Fresh bananas, apples without skin, cherries, grapes, cantaloupe, grapefruit, peaches, oranges, or plums.  Meat and Other Protein Products Baked or boiled chicken. Eggs. Tofu. Fish. Seafood. Smooth peanut butter. Ground or well-cooked tender beef, ham, veal, lamb, pork, or poultry.  Dairy Plain yogurt, kefir, and unsweetened liquid yogurt. Lactose-free milk, buttermilk, or soy milk. Plain hard cheese. Beverages Sport drinks. Clear broths. Diluted fruit juices (except prune). Regular, caffeine-free sodas such as ginger ale. Water. Decaffeinated teas. Oral rehydration solutions. Sugar-free beverages not sweetened with sugar alcohols. Other Bouillon, broth, or soups made  from recommended foods.  The items listed above may not be a complete list of recommended foods or beverages. Contact your dietitian for more options. WHAT FOODS ARE NOT RECOMMENDED? Grains Whole grain, whole wheat, bran, or rye  breads, rolls, pastas, crackers, and cereals. Wild or brown rice. Cereals that contain more than 2 g of fiber per serving. Corn tortillas or taco shells. Cooked or dry oatmeal. Granola. Popcorn. Vegetables Raw vegetables. Cabbage, broccoli, Brussels sprouts, artichokes, baked beans, beet greens, corn, kale, legumes, peas, sweet potatoes, and yams. Potato skins. Cooked spinach and cabbage. Fruits Dried fruit, including raisins and dates. Raw fruits. Stewed or dried prunes. Fresh apples with skin, apricots, mangoes, pears, raspberries, and strawberries.  Meat and Other Protein Products Chunky peanut butter. Nuts and seeds. Beans and lentils. Tomasa BlaseBacon.  Dairy High-fat cheeses. Milk, chocolate milk, and beverages made with milk, such as milk shakes. Cream. Ice cream. Sweets and Desserts Sweet rolls, doughnuts, and sweet breads. Pancakes and waffles. Fats and Oils Butter. Cream sauces. Margarine. Salad oils. Plain salad dressings. Olives. Avocados.  Beverages Caffeinated beverages (such as coffee, tea, soda, or energy drinks). Alcoholic beverages. Fruit juices with pulp. Prune juice. Soft drinks sweetened with high-fructose corn syrup or sugar alcohols. Other Coconut. Hot sauce. Chili powder. Mayonnaise. Gravy. Cream-based or milk-based soups.  The items listed above may not be a complete list of foods and beverages to avoid. Contact your dietitian for more information. WHAT SHOULD I DO IF I BECOME DEHYDRATED? Diarrhea can sometimes lead to dehydration. Signs of dehydration include dark urine and dry mouth and skin. If you think you are dehydrated, you should rehydrate with an oral rehydration solution. These solutions can be purchased at pharmacies, retail stores, or online.  Drink -1 cup (120-240 mL) of oral rehydration solution each time you have an episode of diarrhea. If drinking this amount makes your diarrhea worse, try drinking smaller amounts more often. For example, drink 1-3 tsp (5-15 mL)  every 5-10 minutes.  A general rule for staying hydrated is to drink 1-2 L of fluid per day. Talk to your health care provider about the specific amount you should be drinking each day. Drink enough fluids to keep your urine clear or pale yellow. Document Released: 11/13/2003 Document Revised: 08/28/2013 Document Reviewed: 07/16/2013 [facility] Patient Information 2015 [place] [place]. This information is not intended to replace advice given to you by your health care provider. Make sure you discuss any questions you have with your health care provider.

## 2014-10-28 NOTE — Assessment & Plan Note (Signed)
A: Previous diagnosis of HLD. On simvastatin 20mg  for some time. Given risk factors and HTN may benefit from high intensity statin therapy. No lipid panel in > 3 years. No history of statin intolerance.   P:  - FLP - F/U the above for calculation of ASCVD risk and appropriate therapy.  - Adjust as needed.

## 2014-10-28 NOTE — Progress Notes (Signed)
Patient ID: Jeffrey Stanton, male   DOB: August 18, 1956, 59 y.o.   MRN: [id number]   [facility] Yolande Jolly, MD Phone: [phone]  Subjective:   # Reestablishing care / Medication Refills Medical problems below:   - Hypertension - Poorly controlled. Pt. Here with BP to 214 / 119 by mechanical check. Upon my own check BP was 192 / 102. Denies headache, vision changes, nausea, vomiting, abdominal pain, or any other changes. Denies palpitations, SOB, neck or jaw pain. He denies lower extremity swelling. He has been out of his medications for some time due to not being able to get into the clinic since his insurance had relapsed. He was previously on Metoprolol, Lisinopril, Norvasc, and HCTZ. He says he has not taken them in some time.   - Hyperlipidemia - Last lipid panel in 2012. Has been on Zocor since 2013 for lipid control  daily. Has not had any issues with side effects. Denies aches / muscle pain. Denies abdominal pain.   - Diabetes II - Pt. With history of type II DM. He has previously been on Amaryl  daily as well as metformin  BID. He has had good control with these medications as his A1c here today is 5.5 and has previously been 5.2 and 5.4 in years past. He has not had a vision exam done in some time, or a foot exam. He denies peripheral numbness / tingling or weakness. Denies polyurea at this time, or polydypsia. Reports that he is able to eat most of the time, and that limiting factors for his diet are primarily financial.   New Problems:  Diarrhea - Pt. Reports that he has had ongoing cycles of diarrhea and constipation for approximately 2-3 years. He says that he has had chronic diarrhea for which he uses imodium to the point that he becomes constipated, though he also admits that he many times becomes constipated without the imodium. He says that he will have diarrhea with near liquid stools that are large volume requiring multiple flushes while  having a BM to handle the volume. He says that his diarrhea will occur for several days and then transition to constipation for several days until he begins to have diarrhea again. He denies abdominal pain, nausea, vomiting. He does endorse some mild / vague right mid-back pain that has been occurring over the past several weeks, but otherwise has no abdominal symptoms. He has not noticed any dark stools or blood in his stool. He has not traveled to the mountains or overseas. He drinks tap water at home. He notes that the smell is not overtly foul smelling, but is not "great smelling either". He says that he can feel when the diarrhea is about to start. He has never had a colonoscopy as he had previously decided not to undergo screening. He says that he has subjectively lost some weight over the past year, but attributes that to not being able to eat as much due to inability to afford food as often. 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 He says he has been sober for 3 years, and has not had any problem with alcohol. He would like to think more about it before considering colonoscopy.   Depression - Pt. Endorses a strong history of anhedonia, depressed mood, poor appetite, poor sleep, and thoughts of worthlessness for more than one year now. His  PHQ-9 was 25. He has previously had thoughts of not wanting to be alive, but says that he has never had thoughts of harming himself or anyone else. He says that he does not have any current thoughts of SI or HI at this time either. He denies auditory or visual hallucinations. He says that he became depressed mostly after one of his dogs died that he was very attached to. He has never had many friends, and he feels that " no one would miss me if I were gone." He does endorse some periods ,2-3 times per year according to him, that he has increased energy and will remain  awake for long periods of time. He again, denies any use of alcohol or illegal substances at this time. He says that his inability to find a job has also made him depressed. He has had a hard time making ends meet, and that his depression is better when he is able to work. He lives alone with his dogs. He does have access to a firearm. Upon asking him if he would ever use this weapon to harm himself or anyone else, he adamantly replied that he would never hurt himself or anyone else. He came to agreement with me prior to leaving the clinic, that if he came to the place where he was feeling overwhelmed emotionally, and had thoughts of harming himself or anyone else, that he would call the suicide hotline number that we gave him at discharge.    All relevant systems were reviewed and were negative unless otherwise noted in the HPI  Past Medical History Reviewed problem list.  Medications- reviewed and updated Current Outpatient Prescriptions  Medication Sig Dispense Refill  . amLODipine (NORVASC) 10 MG tablet TAKE 1 TABLET (10 MG TOTAL) BY MOUTH DAILY. 90 tablet 3  . aspirin 81 MG tablet Take 1 tablet (81 mg total) by mouth daily. 90 tablet 3  . glimepiride (AMARYL) 2 MG tablet TAKE 1 TABLET (2 MG TOTAL) BY MOUTH DAILY BEFORE BREAKFAST. 30 tablet 4  . hydrochlorothiazide (HYDRODIURIL) 12.5 MG tablet Take 1 tablet (12.5 mg total) by mouth daily. 90 tablet 3  . lisinopril (PRINIVIL,ZESTRIL) 40 MG tablet TAKE ONE TABLET BY MOUTH TWICE DAILY 180 tablet 3  . metFORMIN (GLUCOPHAGE) 1000 MG tablet TAKE ONE TABLET BY MOUTH TWICE DAILY WITH MEALS 60 tablet 4  . metoprolol (LOPRESSOR) 100 MG tablet TAKE 2 TABLETS (200 MG TOTAL) BY MOUTH 2 (TWO) TIMES DAILY. 180 tablet 3  . oxybutynin (DITROPAN-XL) 10 MG 24 hr tablet Take 1 tablet (10 mg total) by mouth daily. 30 tablet 3  . simvastatin (ZOCOR) 20 MG tablet Take 1 tablet (20 mg total) by mouth at bedtime. 90 tablet 0  . simvastatin (ZOCOR) 20 MG tablet TAKE 1  TABLET (20 MG TOTAL) BY MOUTH AT BEDTIME. 90 tablet 3   No current facility-administered medications for this visit.   Chief complaint-noted No additions to family history Social history- patient is a Non smoker  Objective: BP 214/119 mmHg  Pulse 83  Temp(Src) 98.6 F (37 C) (Oral)  Ht 5\' 10"  (1.778 m)  Wt 214 lb 3 oz (97.155 kg)  BMI 30.73 kg/m2 Gen: NAD, alert, cooperative with exam HEENT: NCAT, EOMI, PERRLA, Fundus difficult to visualize, difficulty visualizing retinal arteries. Will need repeat future exam. No LAD, no thyromegaly.  Neck: FROM, supple, No TTP CV: RRR, good S1/S2, S4 audible. no murmur, gallop or rub, 2+ distal pulses.  Resp: CTABL, no wheezes, non-labored Abd:  Mildly distended, SNT, BS present, no guarding or organomegaly, No masses palpable, no stool ball palpable, Liver edge is palpable just below the costal margin and is smooth.  Ext: No edema, warm, normal tone, moves UE/LE spontaneously, 2+ distal pulses.  Neuro: Alert and oriented, CN II-XII intact, 5/5 motor strength in all extremities, full sensation to light touch without any focal sensory deficits, Moving all extremities well, ambulation without difficulty, cerebellar intact.  Skin: no rashes no lesions  Assessment/Plan: See problem based a/p

## 2014-10-28 NOTE — Assessment & Plan Note (Signed)
A; Pt here with what sounds like chronic diarrhea ongoing with cycle of diarrhea and constipation for > 2 years. Eleven pound weight loss by our records in > 2 years with a history of restricted eating 2/2 financial constraints. He denies melena or BRB at this time. Has previously foregone colonoscopy screening. He abuses imodium from the history, and he is also on Metformin 1000mg  BID which he has been on throughout that time period. This muddies the water a bit. His stool is as described in the HPI mostly liquid and voluminous. No stool ball to palpation, and little abdominal pain / discomfort, though could be chronic constipation with encopresis. Differential remains broad, functional bowel disorder, laxitive abuse, chronic constipation with encopresis, infectious cause, inflammatory, dietary, or neoplasm. Given his asymptomatic nature, will begin the workup conservatively at this time. I mentioned colonoscopy again to him, and he says that he will think about it.   P:  - CMET, CBC with Diff, FOBT x 3 - May need rectal exam at next office visit if still complaining of diarrhea  - scale back metformin to 1000mg  daily.  - STOP imodium at this time.  - Dietary modifications given.  - Will follow up regarding desire to get colonoscopy.  - May consider Abdominal film at follow up visit if still complaining.

## 2014-10-29 NOTE — Progress Notes (Signed)
I agree with the resident documentation and plan.   Zeena Starkel MD  

## 2014-10-31 ENCOUNTER — Other Ambulatory Visit: Payer: 59

## 2014-10-31 DIAGNOSIS — E785 Hyperlipidemia, unspecified: Secondary | ICD-10-CM

## 2014-10-31 LAB — POC HEMOCCULT BLD/STL (HOME/3-CARD/SCREEN)
Card #2 Fecal Occult Blod, POC: NEGATIVE
Card #3 Fecal Occult Blood, POC: NEGATIVE
Fecal Occult Blood, POC: POSITIVE

## 2014-10-31 LAB — LIPID PANEL
CHOL/HDL RATIO: 5.9 ratio
Cholesterol: 183 mg/dL (ref 0–200)
HDL: 31 mg/dL — ABNORMAL LOW (ref >=40)
LDL CALC: 112 mg/dL — AB (ref 0–99)
Triglycerides: 202 mg/dL — ABNORMAL HIGH (ref <150)
VLDL: 40 mg/dL (ref 0–40)

## 2014-10-31 NOTE — Addendum Note (Signed)
Addended by: SwazilandJORDAN, Lehua Flores on: 10/31/2014 10:13 AM   Modules accepted: Orders

## 2014-10-31 NOTE — Progress Notes (Signed)
[facility] phlebotomist drew:  LIPID PANEL 

## 2014-11-04 ENCOUNTER — Encounter: Payer: Self-pay | Admitting: Family Medicine

## 2014-11-04 ENCOUNTER — Telehealth: Payer: Self-pay | Admitting: Family Medicine

## 2014-11-04 NOTE — Telephone Encounter (Signed)
Called pt. With results. He is doing well without any symptoms from his blood pressure. Tolerating blood pressure medicine. Will follow up with me on 12/06/2014.   CGM

## 2014-11-21 ENCOUNTER — Ambulatory Visit: Payer: 59 | Admitting: Family Medicine

## 2014-12-04 ENCOUNTER — Ambulatory Visit (INDEPENDENT_AMBULATORY_CARE_PROVIDER_SITE_OTHER): Payer: 59 | Admitting: Family Medicine

## 2014-12-04 ENCOUNTER — Encounter: Payer: Self-pay | Admitting: Family Medicine

## 2014-12-04 VITALS — BP 150/90 | HR 72 | Temp 98.1°F | Ht 70.0 in | Wt 211.0 lb

## 2014-12-04 DIAGNOSIS — Z0000 Encounter for general adult medical examination without abnormal findings: Secondary | ICD-10-CM | POA: Diagnosis not present

## 2014-12-04 DIAGNOSIS — J189 Pneumonia, unspecified organism: Secondary | ICD-10-CM | POA: Insufficient documentation

## 2014-12-04 MED ORDER — CEFDINIR 300 MG PO CAPS: 300.0000 mg | ORAL_CAPSULE | Freq: Two times a day (BID) | ORAL | Status: DC

## 2014-12-04 NOTE — Patient Instructions (Signed)
Nice to meet you!  Take the antibiotics and return to work when you have improved a little bit more  Come back to see Dr. Judie Petit in 2-4 weeks.   Pneumonia Pneumonia is an infection of the lungs.  CAUSES Pneumonia may be caused by bacteria or a virus. Usually, these infections are caused by breathing infectious particles into the lungs (respiratory tract). SIGNS AND SYMPTOMS   Cough.  Fever.  Chest pain.  Increased rate of breathing.  Wheezing.  Mucus production. DIAGNOSIS  If you have the common symptoms of pneumonia, your health care provider will typically confirm the diagnosis with a chest X-ray. The X-ray will show an abnormality in the lung (pulmonary infiltrate) if you have pneumonia. Other tests of your blood, urine, or sputum may be done to find the specific cause of your pneumonia. Your health care provider may also do tests (blood gases or pulse oximetry) to see how well your lungs are working. TREATMENT  Some forms of pneumonia may be spread to other people when you cough or sneeze. You may be asked to wear a mask before and during your exam. Pneumonia that is caused by bacteria is treated with antibiotic medicine. Pneumonia that is caused by the influenza virus may be treated with an antiviral medicine. Most other viral infections must run their course. These infections will not respond to antibiotics.  HOME CARE INSTRUCTIONS   Cough suppressants may be used if you are losing too much rest. However, coughing protects you by clearing your lungs. You should avoid using cough suppressants if you can.  Your health care provider may have prescribed medicine if he or she thinks your pneumonia is caused by bacteria or influenza. Finish your medicine even if you start to feel better.  Your health care provider may also prescribe an expectorant. This loosens the mucus to be coughed up.  Take medicines only as directed by your health care provider.  Do not smoke. Smoking is a common  cause of bronchitis and can contribute to pneumonia. If you are a smoker and continue to smoke, your cough may last several weeks after your pneumonia has cleared.  A cold steam vaporizer or humidifier in your room or home may help loosen mucus.  Coughing is often worse at night. Sleeping in a semi-upright position in a recliner or using a couple pillows under your head will help with this.  Get rest as you feel it is needed. Your body will usually let you know when you need to rest. PREVENTION A pneumococcal shot (vaccine) is available to prevent a common bacterial cause of pneumonia. This is usually suggested for:  People over 76 years old.  Patients on chemotherapy.  People with chronic lung problems, such as bronchitis or emphysema.  People with immune system problems. If you are over 65 or have a high risk condition, you may receive the pneumococcal vaccine if you have not received it before. In some countries, a routine influenza vaccine is also recommended. This vaccine can help prevent some cases of pneumonia.You may be offered the influenza vaccine as part of your care. If you smoke, it is time to quit. You may receive instructions on how to stop smoking. Your health care provider can provide medicines and counseling to help you quit. SEEK MEDICAL CARE IF: You have a fever. SEEK IMMEDIATE MEDICAL CARE IF:   Your illness becomes worse. This is especially true if you are elderly or weakened from any other disease.  You cannot control  your cough with suppressants and are losing sleep.  You begin coughing up blood.  You develop pain which is getting worse or is uncontrolled with medicines.  Any of the symptoms which initially brought you in for treatment are getting worse rather than better.  You develop shortness of breath or chest pain. MAKE SURE YOU:   Understand these instructions.  Will watch your condition.  Will get help right away if you are not doing well or get  worse. Document Released: 08/23/2005 Document Revised: 01/07/2014 Document Reviewed: 11/12/2010 [facility] Patient Information 2015 [employer] [place]. This information is not intended to replace advice given to you by your health care provider. Make sure you discuss any questions you have with your health care provider.

## 2014-12-04 NOTE — Assessment & Plan Note (Signed)
Febrile illness with predominant cough and dyspnea Treat as CAP- omnicef Follow up with PCP in 2-4 weeks.

## 2014-12-04 NOTE — Assessment & Plan Note (Signed)
Recent Hemoccult 3 with one positive card Patient apprehensive to get colonoscopy due to fears about anesthesia giDiscussed with him in detail, he would like to meet with GI to consider colonoscopy for

## 2014-12-04 NOTE — Progress Notes (Signed)
Patient ID: Jeffrey GullyRandy M Musto, male   DOB: May 04, 1956, 59 y.o.   MRN: [id number]   HPI  Patient presents today for cough and dyspnea  Patient explains that his symptoms started about 4 days ago. He's had predominant cough and difficulty breathing. He has central chest pain with cough but none that is exertional. He further describes malaise,  intermittent chills, sweats, and myalgias.  He is slightly improved today but still feels unwell.    he also describes a story about his last visit with anesthesia when he believed that he was waking up inside a coffin as he came out of anesthesia. Because of this history worried about going under again.   PMH: Nonsmoker  ROS: Per HPI  Objective: BP 150/90 mmHg  Pulse 72  Temp(Src) 98.1 F (36.7 C) (Oral)  Ht 5\' 10"  (1.778 m)  Wt 211 lb (95.709 kg)  BMI 30.28 kg/m2  SpO2 97% Gen: NAD, alert, cooperative with exam HEENT: NCAT, MMM CV: RRR, good S1/S2, no murmur Resp: no wheezes, non-labored, scattered mild coarse sounds Ext: No edema, warm Neuro: Alert and oriented, No gross deficits  Assessment and plan:  CAP (community acquired pneumonia) Febrile illness with predominant cough and dyspnea Treat as CAP- omnicef Follow up with PCP in 2-4 weeks.    Healthcare maintenance Recent Hemoccult 3 with one positive card Patient apprehensive to get colonoscopy due to fears about anesthesia giDiscussed with him in detail, he would like to meet with GI to consider colonoscopy for    Orders Placed This Encounter  Procedures  . Ambulatory referral to Gastroenterology    Referral Priority:  Routine    Referral Type:  Consultation    Referral Reason:  Specialty Services Required    Requested Specialty:  Gastroenterology    Number of Visits Requested:  1    Meds ordered this encounter  Medications  . cefdinir (OMNICEF) 300 MG capsule    Sig: Take 1 capsule (300 mg total) by mouth 2 (two) times daily.    Dispense:  14 capsule    Refill:   0

## 2014-12-06 ENCOUNTER — Ambulatory Visit: Payer: 59 | Admitting: Family Medicine

## 2014-12-20 ENCOUNTER — Ambulatory Visit (INDEPENDENT_AMBULATORY_CARE_PROVIDER_SITE_OTHER): Payer: 59 | Admitting: Family Medicine

## 2014-12-20 ENCOUNTER — Telehealth: Payer: Self-pay | Admitting: Family Medicine

## 2014-12-20 ENCOUNTER — Encounter: Payer: Self-pay | Admitting: Family Medicine

## 2014-12-20 VITALS — BP 180/80 | HR 73 | Temp 98.7°F | Ht 70.0 in | Wt 212.0 lb

## 2014-12-20 DIAGNOSIS — F32A Depression, unspecified: Secondary | ICD-10-CM

## 2014-12-20 DIAGNOSIS — F322 Major depressive disorder, single episode, severe without psychotic features: Secondary | ICD-10-CM

## 2014-12-20 DIAGNOSIS — Z1211 Encounter for screening for malignant neoplasm of colon: Secondary | ICD-10-CM

## 2014-12-20 DIAGNOSIS — K529 Noninfective gastroenteritis and colitis, unspecified: Secondary | ICD-10-CM | POA: Diagnosis not present

## 2014-12-20 DIAGNOSIS — F329 Major depressive disorder, single episode, unspecified: Secondary | ICD-10-CM | POA: Diagnosis not present

## 2014-12-20 DIAGNOSIS — F1010 Alcohol abuse, uncomplicated: Secondary | ICD-10-CM

## 2014-12-20 DIAGNOSIS — F1011 Alcohol abuse, in remission: Secondary | ICD-10-CM | POA: Insufficient documentation

## 2014-12-20 LAB — POCT SEDIMENTATION RATE: POCT SED RATE: 9 mm/hr (ref 0–22)

## 2014-12-20 LAB — TSH: TSH: 1.185 u[IU]/mL (ref 0.350–4.500)

## 2014-12-20 NOTE — Assessment & Plan Note (Signed)
A: Pt. Continues to have symptoms of major depression. PH-Q 2 negative today, though he endorses other symptoms. He continues to rely on his own coping mechanisms for treatment. He does not desire counseling, or pharmacotherapy at this time. He has no SI/HI at this time.   P:  - Continue to monitor clinically at follow up appointment.  - Will continue to attempt to provide some therapy myself at his follow up visits.  - Hopefully can get him to counseling in the future / on pharmacotherapy as I think this will benefit his overall health.  - Will consider UDS / ETOH level for evaluation in the future if he is agreeable.

## 2014-12-20 NOTE — Assessment & Plan Note (Signed)
A: Pt. With continued chronic diarrhea. Alarm symptoms include positive home stool card x 2, weight loss is slightly concerning, though he did go through a period of time with financial strain and was eating very little and only twice/ day. Laboratory analysis at last visit without electrolyte abnormalities, impaired liver function, or anemia. MCV appropriate. He is 19 and up to this point has refused colonoscopy, but is now agreeable to GI referral for ongoing workup and colonoscopy for his chronic diarrhea.  P:  - TSH today - ESR today - Hold off on stool studies for now.  - Referral to Gastroenterology for evaluation and c-scope.  - Stop imodium for now.  - Stopping metformin completely today given A1C of 5.5 .  - Will follow up after GI evaluation and C-scope.

## 2014-12-20 NOTE — Assessment & Plan Note (Signed)
A: History of alcohol abuse. Says he hasn't had a drink in > 20 years. Laboratory evaluation without impaired liver function.   P:  - Monitor closely for relapse.  - Screening at office visits

## 2014-12-20 NOTE — Telephone Encounter (Signed)
Called pt. Due to elevated BP during office visit today, that we were unable to discuss. Instructed him to return to the office for nurse appointment to get his BP checked in the next few days. Holding off on increasing his medications for right now. Will follow up with him after BP check appointment. No headaches, or vision changes per my ROS at his office visit today.   CGM, MD

## 2014-12-20 NOTE — Progress Notes (Signed)
Patient ID: Jeffrey Stanton, male   DOB: 04-09-1956, 59 y.o.   MRN: [id number]   [facility] Yolande Jolly, MD Phone: [phone]  Subjective:   # Chronic Diarrhea - Pt. Here with continued chronic diarrhea.  - Has continued to take imodium. Says that he needed it despite our discussion about not taking it after the last visit.  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  - No improvement despite backing down on the metformin at his last visit.  - Diarrhea is predominantly liquid. He describes as dark (milkshake in appearance).  - He denies blood / mucus.  - He denies fevers, chills, nausea, or vomiting.  - He has had some 10-12 lb weight loss over the past two years, but some subjective weight gain since our last visit.  - He reports slight discomfort, but denies frank abdominal pain.  - No family history of inflammatory bowel disease, endocrine disorder, autoimmune disorder. No personal history of thyroid disorder.  - No history of recent travel. No dermatological changes at this time.  - Does have a history of alcoholism, though he is adamant that he has not had a drink in more than twenty years.  - He previously denied referral to GI for evaluation and colonoscopy after FOBT x 3 was positive on two cards. He has since been seen by Dr. Ermalinda Memos and myself who both have continued to discuss this with him. He is now ok for referral and c-scope.  - He has continued untreated depression.   # Depression.  - Continues to have symptoms of worthlessness, loneliness, sleeplessness, poor appetite.  - coping mechanisms include taking a walk, getting his dog and going for a drive.  - Per him his support is primarily his dog. He does not have a network of friends or family that he feels comfortable opening up to.  - He says that he does not like to open up to anyone, and has a hard time  even talking to me about how he feels.  - He continues to deny SI/ HI. He appears to be in better shape today with regard to personal hygiene and overall appearance.  - He denies racing thoughts, compulsive behavior, or periods of increased production.  - He is working now, and enjoys work.  - He does not feel that his depression is affecting his life in such a way as to impair his quality or function in life.  - He would not like treatment at this time, and he feels that his coping strategies work for him. He is ok with continuing to discuss his depression, but would not like to speak with a counselor at this time.   # History of Alcohol Abuse   - I did review this history with the patient today given the above two problems.  - He continues to endorse abstinence and says that he has not had a drink in > 20 years.  - He says that he has other coping mechanisms for his depression that are working for him.  - He has never used other substances outside of ETOH.   All relevant systems were reviewed and were negative unless otherwise noted in the HPI  Past Medical History Reviewed problem list.  Medications- reviewed and updated Current Outpatient Prescriptions  Medication Sig Dispense Refill  . amLODipine (NORVASC) 10 MG tablet TAKE 1  TABLET (10 MG TOTAL) BY MOUTH DAILY. 90 tablet 3  . aspirin 81 MG tablet Take 1 tablet (81 mg total) by mouth daily. 90 tablet 3  . cefdinir (OMNICEF) 300 MG capsule Take 1 capsule (300 mg total) by mouth 2 (two) times daily. 14 capsule 0  . glimepiride (AMARYL) 2 MG tablet TAKE 1 TABLET (2 MG TOTAL) BY MOUTH DAILY BEFORE BREAKFAST. 30 tablet 4  . hydrochlorothiazide (HYDRODIURIL) 12.5 MG tablet Take 1 tablet (12.5 mg total) by mouth daily. 90 tablet 3  . lisinopril (PRINIVIL,ZESTRIL) 40 MG tablet TAKE ONE TABLET BY MOUTH TWICE DAILY 180 tablet 3  . metoprolol (LOPRESSOR) 100 MG tablet TAKE 2 TABLETS (200 MG TOTAL) BY MOUTH 2 (TWO) TIMES DAILY. 180 tablet 3  .  oxybutynin (DITROPAN-XL) 10 MG 24 hr tablet Take 1 tablet (10 mg total) by mouth daily. 30 tablet 3  . simvastatin (ZOCOR) 20 MG tablet Take 1 tablet (20 mg total) by mouth at bedtime. 90 tablet 0  . simvastatin (ZOCOR) 20 MG tablet TAKE 1 TABLET (20 MG TOTAL) BY MOUTH AT BEDTIME. 90 tablet 3   No current facility-administered medications for this visit.   Chief complaint-noted No additions to family history Social history- patient is a non smoker  Objective: BP 180/80 mmHg  Pulse 73  Temp(Src) 98.7 F (37.1 C) (Oral)  Ht 5\' 10"  (1.778 m)  Wt 212 lb (96.163 kg)  BMI 30.42 kg/m2  SpO2 99% Gen: NAD, alert, cooperative with exam HEENT: NCAT, EOMI, PERRL, Eyes are blood shot, Slightly enlarged thyroid without nodules or tenderness.  Neck: FROM, supple, No LAD CV: RRR, good S1/S2, no murmur Resp: CTABL, no wheezes, non-labored Abd: Soft, Uncomfortable but not frankly tender to palpation over the RUQ and RLQ, Regular bowel sounds, no guarding or organomegaly, liver is 2cm below the costal margin and the liver edge is smooth.  Ext: No edema, warm, normal tone, moves UE/LE spontaneously Neuro: Alert and oriented, No gross deficits Skin: no rashes no lesions  Assessment/Plan: See problem based a/p

## 2014-12-20 NOTE — Patient Instructions (Signed)
Thanks for coming in today!   We will refer you to Gastroenterology for Colonoscopy.   We will need to get more studies of your stool for evaluation.   Keep using the coping mechanisms for dealing with stress that you have been.   Thanks for letting us take care of you.   Sincerely,  Devota Pacealeb Makaela Cando, MD Family Medicine - PGY 1

## 2014-12-21 LAB — HIV ANTIBODY (ROUTINE TESTING W REFLEX): HIV: NONREACTIVE

## 2014-12-25 ENCOUNTER — Telehealth: Payer: Self-pay | Admitting: Family Medicine

## 2014-12-25 NOTE — Telephone Encounter (Signed)
Called pt. To inform of negative test results.   CGM MD

## 2015-02-03 ENCOUNTER — Other Ambulatory Visit: Payer: Self-pay | Admitting: Family Medicine

## 2015-06-04 ENCOUNTER — Other Ambulatory Visit: Payer: Self-pay | Admitting: Family Medicine

## 2015-06-21 ENCOUNTER — Other Ambulatory Visit: Payer: Self-pay | Admitting: Family Medicine

## 2015-10-15 ENCOUNTER — Ambulatory Visit (INDEPENDENT_AMBULATORY_CARE_PROVIDER_SITE_OTHER): Payer: BLUE CROSS/BLUE SHIELD | Admitting: Family Medicine

## 2015-10-15 ENCOUNTER — Encounter: Payer: Self-pay | Admitting: Family Medicine

## 2015-10-15 VITALS — BP 158/100 | HR 87 | Temp 99.0°F | Ht 70.0 in | Wt 221.9 lb

## 2015-10-15 DIAGNOSIS — R05 Cough: Secondary | ICD-10-CM | POA: Diagnosis not present

## 2015-10-15 DIAGNOSIS — J029 Acute pharyngitis, unspecified: Secondary | ICD-10-CM

## 2015-10-15 DIAGNOSIS — R059 Cough, unspecified: Secondary | ICD-10-CM

## 2015-10-15 LAB — POCT RAPID STREP A (OFFICE): Rapid Strep A Screen: NEGATIVE

## 2015-10-15 MED ORDER — AZITHROMYCIN 250 MG PO TABS: ORAL_TABLET | ORAL | Status: DC

## 2015-10-15 NOTE — Progress Notes (Signed)
   Subjective:    Patient ID: Jeffrey Stanton, male    DOB: 1955/10/03, 60 y.o.   MRN: [id number]  HPI  Patient presents for Same Day Appointment  CC: Cough  # Cough:  Present x 1 week  Started off not so bad, but gotten worse in past few days with shortness of breath and cough is now productive. Cough primarily worse at night  Also says he has been having significant sore throat to the point that he was unable to swallow his pills yesterday and today (didn't take his BP, DM medications)  Initially says no fevers but has had some subjective fevers at night time  Some mild nasal congestion but not really that bad  Has tried alka seltzer cold, nyquil, coricidin ROS: no nausea/vomiting, no diarrhea/constipation, no CP  Social Hx: never smoker  Review of Systems   See HPI for ROS.   Past medical history, surgical, family, and social history reviewed and updated in the EMR as appropriate.  Objective:  BP 158/100 mmHg  Pulse 87  Temp(Src) 99 F (37.2 C) (Oral)  Wt 221 lb 14.4 oz (100.653 kg)  SpO2 94% Vitals and nursing note reviewed  General: mildly ill appearing HEENT: some conjunctival injection bilaterally. Moist mucous membranes. Posterior pharyngeal erythema and exudate present CV: normal rate, regular rhythm, normal heart sounds, no murmurs, rubs or gallop. Resp: faint crackles RML, one end exp wheeze but not with each breath; normal effort. Moderate cough. Skin: no rashes appreciated  Assessment & Plan:   1. Cough/Sore throat Negative rapid strep. With some subjective fevers and worsening cough will treat as bronchitis with z-pack. Discussed continuing OTC cough/cold, hydration, hand hygiene, if not improving after z-pack to return to clinic. Did discuss cough persisting longer than a month as a possibility, biggest symptom to pay attention to is shortness of breath and overall improvement. - POCT rapid strep A

## 2015-12-20 ENCOUNTER — Other Ambulatory Visit: Payer: Self-pay | Admitting: Family Medicine

## 2015-12-29 ENCOUNTER — Other Ambulatory Visit: Payer: Self-pay | Admitting: Family Medicine

## 2015-12-29 NOTE — Telephone Encounter (Signed)
Amaryl refilled.  Thanks, Joanna Puffrystal S. Bruchy Mikel, MD [facility] Res[facility]  12/29/2015, 12:06 PM

## 2016-01-12 ENCOUNTER — Other Ambulatory Visit: Payer: Self-pay | Admitting: *Deleted

## 2016-01-12 DIAGNOSIS — I10 Essential (primary) hypertension: Secondary | ICD-10-CM

## 2016-01-12 NOTE — Telephone Encounter (Signed)
Pt calling in because he needs refill on his amlodipine. Pt states pharmacy has been sending requests to us please advise. Jeffrey Stanton Jeffrey Stanton, CMA

## 2016-01-12 NOTE — Telephone Encounter (Signed)
No previous request for this medication have been received through epic. Kynesha Guerin,CMA

## 2016-01-13 MED ORDER — AMLODIPINE BESYLATE 10 MG PO TABS: ORAL_TABLET | ORAL | Status: DC

## 2016-01-13 NOTE — Telephone Encounter (Signed)
2nd request.  Reneta Niehaus L, RN  

## 2016-01-27 ENCOUNTER — Other Ambulatory Visit: Payer: Self-pay | Admitting: *Deleted

## 2016-01-28 MED ORDER — GLIMEPIRIDE 2 MG PO TABS: ORAL_TABLET | ORAL | Status: DC

## 2016-02-26 ENCOUNTER — Other Ambulatory Visit: Payer: Self-pay | Admitting: Family Medicine

## 2016-02-26 MED ORDER — GLIMEPIRIDE 2 MG PO TABS: ORAL_TABLET | ORAL | Status: DC

## 2016-02-26 NOTE — Telephone Encounter (Signed)
Will forward to Dr. Jennette KettleNeal who is precepting this morning.  Tildon Silveria,CMA

## 2016-02-26 NOTE — Telephone Encounter (Signed)
Pt is calling for a refill on his Glimepiride. He will be making an appointment but not till August. 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 jw

## 2016-03-16 ENCOUNTER — Other Ambulatory Visit: Payer: Self-pay | Admitting: *Deleted

## 2016-03-16 DIAGNOSIS — I10 Essential (primary) hypertension: Secondary | ICD-10-CM

## 2016-03-17 MED ORDER — LISINOPRIL 40 MG PO TABS: ORAL_TABLET | ORAL | Status: DC

## 2016-03-17 NOTE — Telephone Encounter (Signed)
Patient has not been seen for HTN since Feb 2016. Please have scheduled for appointment. Refills sent.

## 2016-03-18 NOTE — Telephone Encounter (Signed)
Spoke with mother of patient and he is at work.  Asked her to have him give us a call back to schedule an appt with his new PCP to discuss his blood pressure. Lydian Chavous,CMA

## 2016-03-22 ENCOUNTER — Other Ambulatory Visit: Payer: Self-pay | Admitting: Family Medicine

## 2016-03-22 DIAGNOSIS — E118 Type 2 diabetes mellitus with unspecified complications: Secondary | ICD-10-CM

## 2016-03-22 NOTE — Telephone Encounter (Deleted)
Rx refilled for 1 month. Patient has not been seen for diabetes since Feb 2016 and need appointment please.

## 2016-03-23 NOTE — Telephone Encounter (Signed)
LM for patient that he would need to call back and schedule an appt with PCP to follow up on diabetes.  Letter also printed and mailed to patient. Jazmin Hartsell,CMA

## 2016-04-07 ENCOUNTER — Telehealth: Payer: Self-pay

## 2016-04-07 ENCOUNTER — Encounter: Payer: Self-pay | Admitting: Family Medicine

## 2016-04-07 ENCOUNTER — Ambulatory Visit (INDEPENDENT_AMBULATORY_CARE_PROVIDER_SITE_OTHER): Payer: BLUE CROSS/BLUE SHIELD | Admitting: Family Medicine

## 2016-04-07 VITALS — BP 179/100 | HR 62 | Temp 98.2°F | Wt 234.0 lb

## 2016-04-07 DIAGNOSIS — R5383 Other fatigue: Secondary | ICD-10-CM | POA: Diagnosis not present

## 2016-04-07 DIAGNOSIS — E118 Type 2 diabetes mellitus with unspecified complications: Secondary | ICD-10-CM | POA: Diagnosis not present

## 2016-04-07 DIAGNOSIS — R079 Chest pain, unspecified: Secondary | ICD-10-CM | POA: Diagnosis not present

## 2016-04-07 DIAGNOSIS — I10 Essential (primary) hypertension: Secondary | ICD-10-CM

## 2016-04-07 DIAGNOSIS — K449 Diaphragmatic hernia without obstruction or gangrene: Secondary | ICD-10-CM

## 2016-04-07 LAB — LIPID PANEL
Cholesterol: 189 mg/dL (ref 125–200)
HDL: 33 mg/dL — ABNORMAL LOW (ref >=40)
LDL CALC: 87 mg/dL (ref <130)
TRIGLYCERIDES: 346 mg/dL — AB (ref <150)
Total CHOL/HDL Ratio: 5.7 Ratio — ABNORMAL HIGH (ref <=5.0)
VLDL: 69 mg/dL — AB (ref <30)

## 2016-04-07 LAB — BASIC METABOLIC PANEL
BUN: 17 mg/dL (ref 7–25)
CO2: 24 mmol/L (ref 20–31)
CREATININE: 1 mg/dL (ref 0.70–1.33)
Calcium: 9 mg/dL (ref 8.6–10.3)
Chloride: 103 mmol/L (ref 98–110)
GLUCOSE: 149 mg/dL — AB (ref 65–99)
POTASSIUM: 3.6 mmol/L (ref 3.5–5.3)
Sodium: 140 mmol/L (ref 135–146)

## 2016-04-07 LAB — CBC
HEMATOCRIT: 41.8 % (ref 38.5–50.0)
HEMOGLOBIN: 14.7 g/dL (ref 13.2–17.1)
MCH: 29 pg (ref 27.0–33.0)
MCHC: 35.2 g/dL (ref 32.0–36.0)
MCV: 82.4 fL (ref 80.0–100.0)
MPV: 11.9 fL (ref 7.5–12.5)
Platelets: 195 10*3/uL (ref 140–400)
RBC: 5.07 MIL/uL (ref 4.20–5.80)
RDW: 14.6 % (ref 11.0–15.0)
WBC: 6.7 10*3/uL (ref 3.8–10.8)

## 2016-04-07 LAB — VITAMIN B12: Vitamin B-12: 524 pg/mL (ref 200–1100)

## 2016-04-07 LAB — POCT GLYCOSYLATED HEMOGLOBIN (HGB A1C): HEMOGLOBIN A1C: 7.3

## 2016-04-07 LAB — TSH: TSH: 1.44 mIU/L (ref 0.40–4.50)

## 2016-04-07 MED ORDER — EMPAGLIFLOZIN 10 MG PO TABS: 10.0000 mg | ORAL_TABLET | Freq: Every day | ORAL | 5 refills | Status: DC

## 2016-04-07 NOTE — Assessment & Plan Note (Signed)
Likely the source of patient's atypical chest pain. Recommended over the counter ranitidine.

## 2016-04-07 NOTE — Assessment & Plan Note (Signed)
Concern for OSA given constellation of daytime somnolence and waking up at night shortness of breath. Discuss sleep study, however patient deferred today as he is concerned that no one would be able to watch his dog. Will check CBC, B12, TSH, and Vitamin D today.

## 2016-04-07 NOTE — Assessment & Plan Note (Signed)
Elevated today. Instructed patient to check his BP over the next 1-2 weeks and let us know if it is consistently above 150/90. Also asked patient to follow up in nurses clinic within 1-2 weeks for recheck.

## 2016-04-07 NOTE — Assessment & Plan Note (Signed)
A1c elevated to 7.3 today. Given past intolerance of metformin, will not restart. Will start jardiance instead. Discussed side effects and reasons to return to care. Follow up with PCP in 2-3 months.

## 2016-04-07 NOTE — Progress Notes (Signed)
Subjective:  Jeffrey Stanton is a 60 y.o. male who presents to the [facility] today with a chief complaint of T2DM follow up.   HPI:   T2DM Currently on glimepiride and is tolerating well without side effects. Was previously on metformin, but had to stop due to diarrhea. Occasionally checks his blood sugars and they are sometimes in the 400s. Has occasionaly polyuria and polydipsia. No palpitations, diaphoresis, or shakiness.   Hypertension BP Readings from Last 3 Encounters:  04/07/16 (!) 179/100  10/15/15 (!) 158/100  12/20/14 (!) 180/80   Home BP monitoring-No Compliant with medications-yes, without side effects ROS-Denies any HA, SOB, blurry vision, LE edema, transient weakness, orthopnea, PND.   Chest Pain Patient with an episode of chest pain last weekend that lasted for several days. Pain was substernal and did not radiate. No clear precipitating event. No shortness of breath, diaphoresis, nausea, or vomiting associated with the pain. Pain was not improved by rest and not made better with exertion. Has not had a recurrence of chest pain over the past week. No fevers or chills. No cough.   Fatigue Patient also with longstanding history of severe fatigue. Patient says that he wakes up feeling awful and feels bad for the rest of the day. He is sleepy during the day and occasionally dozes off. Live alone - not sure if he snores. Occasionally wakes up at night short of breath. Has never had a sleep study done.   ROS: Per HPI  PMH: Smoking history reviewed.   Objective:  Physical Exam: BP (!) 179/100   Pulse 62   Temp 98.2 F (36.8 C) (Oral)   Wt 234 lb (106.1 kg)   SpO2 98%   BMI 33.58 kg/m   Gen: NAD, resting comfortably CV: RRR with no murmurs appreciated Pulm: NWOB, CTAB with no crackles, wheezes, or rhonchi GI: Normal bowel sounds present. Soft, Nontender, Nondistended. MSK: no edema, cyanosis, or clubbing noted Skin: warm, dry Neuro: grossly normal, moves all  extremities Psych: Normal affect and thought content  Results for orders placed or performed in visit on 04/07/16 (from the past 72 hour(s))  POCT A1C     Status: None   Collection Time: 04/07/16 10:14 AM  Result Value Ref Range   Hemoglobin A1C 7.3    Assessment/Plan:  T2DM (type 2 diabetes mellitus) (HCC) A1c elevated to 7.3 today. Given past intolerance of metformin, will not restart. Will start jardiance instead. Discussed side effects and reasons to return to care. Follow up with PCP in 2-3 months.   Essential hypertension Elevated today. Instructed patient to check his BP over the next 1-2 weeks and let us know if it is consistently above 150/90. Also asked patient to follow up in nurses clinic within 1-2 weeks for recheck.   Chest pain Atypical chest pain, not consistent with angina or cardiac etiology. Likely related to hiatal hernia. Regardless, patient has significant risk factors including T2DM and poorly controlled HTN. Will place referral for stress testing to rule out ischemic cardiac disease.   Fatigue Concern for OSA given constellation of daytime somnolence and waking up at night shortness of breath. Discuss sleep study, however patient deferred today as he is concerned that no one would be able to watch his dog. Will check CBC, B12, TSH, and Vitamin D today.   Hiatal hernia Likely the source of patient's atypical chest pain. Recommended over the counter ranitidine.   Katina Degree. Jimmey Ralph, MD [facility] Resident PGY-3 04/07/2016 12:22 PM

## 2016-04-07 NOTE — Telephone Encounter (Signed)
I called pt and left a detailed message on his phone with apt time for his stress test. 04/16/2016 @10am . [facility].

## 2016-04-07 NOTE — Assessment & Plan Note (Signed)
Atypical chest pain, not consistent with angina or cardiac etiology. Likely related to hiatal hernia. Regardless, patient has significant risk factors including T2DM and poorly controlled HTN. Will place referral for stress testing to rule out ischemic cardiac disease.

## 2016-04-07 NOTE — Patient Instructions (Signed)
We will start a new diabetes medication today called Jardiance. This will cause you to urinate out the extra glucose. Let us know if you are feeling dehydrated.  We will set you up to have a stress test.  Please come back within 1-2 weeks to recheck your blood pressure.   We will check blood tests for your fatigue. I think you need to a sleep study done. Let us know if you change your mind.  Please come back to see your regular doctor soon.  Take care,  Dr Jimmey Ralph

## 2016-04-08 ENCOUNTER — Telehealth: Payer: Self-pay | Admitting: Family Medicine

## 2016-04-08 LAB — VITAMIN D 25 HYDROXY (VIT D DEFICIENCY, FRACTURES): VIT D 25 HYDROXY: 11 ng/mL — AB (ref 30–100)

## 2016-04-08 MED ORDER — VITAMIN D3 25 MCG (1000 UT) PO CAPS: 1.0000 | ORAL_CAPSULE | Freq: Every day | ORAL | 3 refills | Status: DC

## 2016-04-08 NOTE — Telephone Encounter (Signed)
Attempted to call patient to discuss lab results. No answer and no voice mail set up. Results significant for low vitamin D. Will send in prescription for vitamin D 1000IU daily. Patient should follow up in 3 months for recheck.  Katina Degree. Jimmey Ralph, MD [facility] Resident PGY-3 04/08/2016 12:02 PM

## 2016-04-15 ENCOUNTER — Encounter: Payer: Self-pay | Admitting: Family Medicine

## 2016-04-16 ENCOUNTER — Telehealth: Payer: Self-pay | Admitting: Radiology

## 2016-04-16 ENCOUNTER — Ambulatory Visit: Payer: BLUE CROSS/BLUE SHIELD

## 2016-04-16 NOTE — Telephone Encounter (Signed)
Patient came in to have his treadmill test done today. His BP was 191/107 before exercise. This is to high to safely do the test. Dr. Anne FuSkains the DOD recommend the patient follow back up with his primary care for BP control. Then have the test rescheduled.

## 2016-04-19 NOTE — Telephone Encounter (Signed)
I tried calling pt- no answer and no option for VM.

## 2016-04-21 ENCOUNTER — Encounter: Payer: Self-pay | Admitting: Student

## 2016-04-21 ENCOUNTER — Ambulatory Visit: Payer: BLUE CROSS/BLUE SHIELD | Admitting: Family Medicine

## 2016-04-21 ENCOUNTER — Ambulatory Visit (INDEPENDENT_AMBULATORY_CARE_PROVIDER_SITE_OTHER): Payer: BLUE CROSS/BLUE SHIELD | Admitting: Student

## 2016-04-21 DIAGNOSIS — I10 Essential (primary) hypertension: Secondary | ICD-10-CM

## 2016-04-21 MED ORDER — HYDROCHLOROTHIAZIDE 12.5 MG PO TABS: 25.0000 mg | ORAL_TABLET | Freq: Every day | ORAL | 3 refills | Status: DC

## 2016-04-21 NOTE — Progress Notes (Signed)
   Subjective:    Patient ID: Avon GullyRandy M Ketter, male    DOB: Dec 11, 1955, 60 y.o.   MRN: [id number]   CC: Follow-up blood pressure  HPI: 60 year old male with a history of potential hypertension presents for follow-up blood pressure   Blood pressure - Seen by PCP 2 weeks ago for elevated blood pressures and asked to record blood pressures at home and follow-up - Patient reports a pressures have been persistently in the systolics of 180s - Stopped recording because he thought these were very distressing - Reports compliance with home blood pressure regimen - Takes chlorothiazide 12.5 mg daily, lisinopril 40 mg daily., Lopressor 100 mg, Lopressor 100 mg twice a day - Denies headache, chest pain, shortness of breath, changes in vision   Smoking status reviewed  Review of Systems  Per HPI, else denies recent illness, fever, abdominal pain, N/V/D, weakness    Objective:  BP (!) 159/86   Pulse 78   Temp 98.7 F (37.1 C) (Oral)   Wt 236 lb (107 kg)   SpO2 98%   BMI 33.86 kg/m  Vitals and nursing note reviewed  General: NAD Cardiac: RRR, Respiratory: CTAB, normal effort Extremities: no edema or cyanosis. Skin: warm and dry, no rashes noted Neuro: alert and oriented,    Assessment & Plan:    Essential hypertension Blood pressure initially 180/98 in clinic but improved to 159/86. No evidence of symptoms of hypertensive emergency - Will increase headache with Dyazide to 25 mg daily - Follow-up in 2 weeks for blood pressure check - ED precautions reviewed    Dalya Maselli A. Kennon RoundsHaney MD, MS Family Medicine Resident PGY-3 Pager [phone]

## 2016-04-21 NOTE — Telephone Encounter (Signed)
Patient has an appt today with Dr. Kennon RoundsHaney for his follow up visit. Jazmin Hartsell,CMA

## 2016-04-21 NOTE — Patient Instructions (Signed)
Follow-up with PCP in 2 weeks for blood pressure check Please take all of your blood pressure medications as prescribed Please check your blood pressure daily and record it and bring to next appointment If you start to have headaches, vision changes, chest pain, shortness of breath go to the emergency department immediately Please take prescribed empagliflozin as prescribed for diabetes If you have questions or concerns call the office at [phone]

## 2016-04-22 ENCOUNTER — Other Ambulatory Visit: Payer: Self-pay | Admitting: Family Medicine

## 2016-04-22 DIAGNOSIS — E118 Type 2 diabetes mellitus with unspecified complications: Secondary | ICD-10-CM

## 2016-04-22 NOTE — Assessment & Plan Note (Signed)
Blood pressure initially 180/98 in clinic but improved to 159/86. No evidence of symptoms of hypertensive emergency - Will increase headache with Dyazide to 25 mg daily - Follow-up in 2 weeks for blood pressure check - ED precautions reviewed

## 2016-05-17 ENCOUNTER — Other Ambulatory Visit: Payer: Self-pay | Admitting: *Deleted

## 2016-05-18 MED ORDER — METOPROLOL TARTRATE 100 MG PO TABS: 100.0000 mg | ORAL_TABLET | Freq: Two times a day (BID) | ORAL | 0 refills | Status: DC

## 2016-05-24 ENCOUNTER — Telehealth: Payer: Self-pay | Admitting: Family Medicine

## 2016-05-24 ENCOUNTER — Other Ambulatory Visit: Payer: Self-pay | Admitting: Family Medicine

## 2016-05-24 MED ORDER — METOPROLOL TARTRATE 100 MG PO TABS: ORAL_TABLET | ORAL | 2 refills | Status: DC

## 2016-05-24 MED ORDER — METOPROLOL TARTRATE 100 MG PO TABS: 200.0000 mg | ORAL_TABLET | Freq: Two times a day (BID) | ORAL | 2 refills | Status: DC

## 2016-05-24 NOTE — Telephone Encounter (Signed)
LMTRC. Please notify pt of correction when he calls.

## 2016-05-24 NOTE — Telephone Encounter (Signed)
Pt noticed his RX for metropolol was decreased in strength.  He was taking 200mg  twice a day but it was changed to 100 mg twice a day. He wanted to make sure it was suppose to be decreased. Please advise

## 2016-05-24 NOTE — Telephone Encounter (Signed)
My apologies, please continue to take the 200mg  twice a day. I have called your pharmacy with the correct prescription.

## 2016-06-01 ENCOUNTER — Other Ambulatory Visit: Payer: Self-pay | Admitting: Family Medicine

## 2016-06-02 NOTE — Telephone Encounter (Signed)
Any refills given will be placed on file with the pharmacy for future use. Sarahbeth Cashin,CMA

## 2016-08-11 ENCOUNTER — Other Ambulatory Visit: Payer: Self-pay | Admitting: Family Medicine

## 2016-08-11 DIAGNOSIS — E118 Type 2 diabetes mellitus with unspecified complications: Secondary | ICD-10-CM

## 2016-09-16 ENCOUNTER — Other Ambulatory Visit: Payer: Self-pay | Admitting: Family Medicine

## 2016-09-16 DIAGNOSIS — E118 Type 2 diabetes mellitus with unspecified complications: Secondary | ICD-10-CM

## 2016-10-13 ENCOUNTER — Other Ambulatory Visit: Payer: Self-pay | Admitting: Family Medicine

## 2016-10-13 DIAGNOSIS — E118 Type 2 diabetes mellitus with unspecified complications: Secondary | ICD-10-CM

## 2016-10-22 ENCOUNTER — Ambulatory Visit (INDEPENDENT_AMBULATORY_CARE_PROVIDER_SITE_OTHER): Payer: BLUE CROSS/BLUE SHIELD | Admitting: Family Medicine

## 2016-10-22 ENCOUNTER — Encounter: Payer: Self-pay | Admitting: Family Medicine

## 2016-10-22 VITALS — BP 162/90 | HR 74 | Temp 98.9°F | Ht 70.0 in | Wt 228.0 lb

## 2016-10-22 DIAGNOSIS — E118 Type 2 diabetes mellitus with unspecified complications: Secondary | ICD-10-CM | POA: Diagnosis not present

## 2016-10-22 DIAGNOSIS — E119 Type 2 diabetes mellitus without complications: Secondary | ICD-10-CM

## 2016-10-22 DIAGNOSIS — G5761 Lesion of plantar nerve, right lower limb: Secondary | ICD-10-CM

## 2016-10-22 DIAGNOSIS — I10 Essential (primary) hypertension: Secondary | ICD-10-CM

## 2016-10-22 LAB — POCT GLYCOSYLATED HEMOGLOBIN (HGB A1C): Hemoglobin A1C: 7.7

## 2016-10-22 MED ORDER — GLIMEPIRIDE 1 MG PO TABS: 3.0000 mg | ORAL_TABLET | Freq: Every day | ORAL | 2 refills | Status: DC

## 2016-10-22 MED ORDER — BLOOD GLUCOSE MONITOR KIT: PACK | 0 refills | Status: DC

## 2016-10-22 NOTE — Patient Instructions (Signed)
It was good to meet you today  For your diabetes and blood pressure - Please make an appointment with pharmacy clinic to ambulatory blood pressure monitoring - Please increase your glimepiride from 2mg  to 3mg  daily. You should check your blood sugar if you feel symptoms of low sugar:dizziness, sweatiness/shakiness, nausea. Pharmacy clinic can help you with the glucometer as well.  For the neuroma in your foot you can try padding as needed  I will see you after you see pharmacy clinic.  Take care and seek immediate care sooner if you develop any concerns.   Dr. Leland HerElsia J Torben Soloway, DO [facility]

## 2016-10-22 NOTE — Progress Notes (Signed)
1550

## 2016-10-22 NOTE — Progress Notes (Signed)
    Subjective:  Jeffrey Stanton is a 61 y.o. male who [facility] for diabetes and HTN followup.   HPI:  HTN Last seen for elevated BP in August 2017. States has had high BP his entire adult life.  Is taking HCTZ 25mg  daily, lisinopril 40 daily, metoprolol 100mg  BID, and amlodipine 10mg  daily, endorses medication compliance, tolerating well. States BP is always more elevated in the office but has also been elevated at home in the past. Hypertension ROS: patient does not perform home BP monitoring, no TIA's, no chest pain on exertion, no dyspnea on exertion, no swelling of ankles and no orthostatic dizziness or lightheadedness.  New concerns: does have occasional HA, feels like vision is worsening "blurry sometimes but not currently", denies double vision.   Diabetes Currently on glimepiride 2mg  and is tolerating well. Previously on metformin which caused him to have diarrhea even when retrialed on lower dose. Has also been tried on jardiance which he was also not able to tolerate due to frequent urination that interfered significantly with his work life. Eats a low carb diet "mostly beef, pork, chicken" with vegetables. Used to check sugars remotely in the past but no longer has home monitor.  Foot pain Has noticed small area of tenderness between 2nd and 3rd toes in forefoot. Used to wear confining supports for his shoe due to flat foot. No redness, no numbness.    ROS: Per HPI  Objective:  Physical Exam: BP (!) 162/90   Pulse 74   Temp 98.9 F (37.2 C) (Oral)   Ht 5\' 10"  (1.778 m)   Wt 228 lb (103.4 kg)   SpO2 98%   BMI 32.71 kg/m   Gen: NAD, resting comfortably CV: RRR with no murmurs appreciated Pulm: NWOB, CTAB with no crackles, wheezes, or rhonchi GI: Normal bowel sounds present. Soft, Nontender, Nondistended. MSK: small point tenderness between 2nd and 3rd metatarsals in R foot. no edema, cyanosis, or clubbing noted Skin: warm, dry Neuro: grossly normal,  moves all extremities Psych: Normal affect and thought content  Results for orders placed or performed in visit on 10/22/16 (from the past 72 hour(s))  HgB A1c     Status: None   Collection Time: 10/22/16  3:50 PM  Result Value Ref Range   Hemoglobin A1C 7.7      Assessment/Plan:  Essential hypertension Per chart review patient blood pressure has persistently been >150/90s and is now taking 4 different BP medications (HCTZ 25mg  daily, lisinopril 40 daily, metoprolol 100mg  BID, and amlodipine 10mg  daily) without improvement in BP. Discussed avoidance of salt and red meat today, patient in agreement. However given the multiple medications and patient history that BP is higher in clinic, patient to go to pharmacy clinic for ambulatory BP monitoring.  T2DM (type 2 diabetes mellitus) (HCC) Patient's a1c today 7.7 increased from 7.3 in August 2017. Has failed metformin and jardiance. Will increase glimepiride to 3mg  daily and provide patient with home glucose monitor to check bood sugar in case of symptoms of hypoglycemia. Appreciate assistance from pharmacy clinic to instruct patient how to use since patient has not used glucometer for a long time.  Morton neuroma, right Advised patient to try padded shoe insert since patient states pain is minor. Informed patient that if pain becomes worse and unmanageable that steroid injection is option   Leland HerElsia J Colbin Jovel, DO PGY-1, [facility] 10/22/2016 10:37 PM

## 2016-10-23 DIAGNOSIS — G5761 Lesion of plantar nerve, right lower limb: Secondary | ICD-10-CM | POA: Insufficient documentation

## 2016-10-23 NOTE — Assessment & Plan Note (Signed)
Patient's a1c today 7.7 increased from 7.3 in August 2017. Has failed metformin and jardiance. Will increase glimepiride to 3mg  daily and provide patient with home glucose monitor to check bood sugar in case of symptoms of hypoglycemia. Appreciate assistance from pharmacy clinic to instruct patient how to use since patient has not used glucometer for a long time.

## 2016-10-23 NOTE — Assessment & Plan Note (Signed)
Advised patient to try padded shoe insert since patient states pain is minor. Informed patient that if pain becomes worse and unmanageable that steroid injection is option

## 2016-10-23 NOTE — Assessment & Plan Note (Signed)
Per chart review patient blood pressure has persistently been >150/90s and is now taking 4 different BP medications (HCTZ 25mg  daily, lisinopril 40 daily, metoprolol 100mg  BID, and amlodipine 10mg  daily) without improvement in BP. Discussed avoidance of salt and red meat today, patient in agreement. However given the multiple medications and patient history that BP is higher in clinic, patient to go to pharmacy clinic for ambulatory BP monitoring.

## 2016-11-04 ENCOUNTER — Other Ambulatory Visit: Payer: Self-pay | Admitting: Family Medicine

## 2017-02-17 ENCOUNTER — Other Ambulatory Visit: Payer: Self-pay | Admitting: Family Medicine

## 2017-03-01 ENCOUNTER — Other Ambulatory Visit: Payer: Self-pay | Admitting: Family Medicine

## 2017-03-01 DIAGNOSIS — E119 Type 2 diabetes mellitus without complications: Secondary | ICD-10-CM

## 2017-03-02 MED ORDER — GLIMEPIRIDE 1 MG PO TABS: ORAL_TABLET | ORAL | 1 refills | Status: DC

## 2017-03-02 NOTE — Telephone Encounter (Signed)
Can patient please be called and asked to make a DM and HTN follow up appt? Thank you.

## 2017-03-02 NOTE — Telephone Encounter (Signed)
Sorry about that. New Rx sent in for #90.

## 2017-03-02 NOTE — Telephone Encounter (Signed)
Jennifer Pharmacist with [employer] just called to get clarification on the quantity of glimepiride 1 mg tablet.  A quantity of #30 was sent for 10 day supply.  Please give her a call at [phone] or send in new Rx. Clovis PuMartin, Tamika L, RN

## 2017-04-07 ENCOUNTER — Other Ambulatory Visit: Payer: Self-pay | Admitting: Family Medicine

## 2017-04-20 ENCOUNTER — Other Ambulatory Visit: Payer: Self-pay | Admitting: *Deleted

## 2017-04-20 DIAGNOSIS — I10 Essential (primary) hypertension: Secondary | ICD-10-CM

## 2017-04-20 MED ORDER — AMLODIPINE BESYLATE 10 MG PO TABS
ORAL_TABLET | ORAL | 3 refills | Status: DC
Start: 2017-04-20 — End: 2017-07-07

## 2017-05-22 ENCOUNTER — Other Ambulatory Visit: Payer: Self-pay | Admitting: Family Medicine

## 2017-05-22 ENCOUNTER — Other Ambulatory Visit: Payer: Self-pay | Admitting: Internal Medicine

## 2017-05-22 DIAGNOSIS — E119 Type 2 diabetes mellitus without complications: Secondary | ICD-10-CM

## 2017-05-23 NOTE — Telephone Encounter (Signed)
LM for patient to call back and schedule a 24 hour BP monitor with Dr. Raymondo Band or at least to see Dr. Artist Pais for BP follow up. Jazmin Hartsell,CMA

## 2017-05-23 NOTE — Telephone Encounter (Signed)
Rx refilled but patient need to go to pharm clinic for amb BP monitoring since he was uncontrolled at last visit. Or at least the least should come back in for BP recheck.

## 2017-07-07 ENCOUNTER — Telehealth: Payer: Self-pay | Admitting: Family Medicine

## 2017-07-07 DIAGNOSIS — E119 Type 2 diabetes mellitus without complications: Secondary | ICD-10-CM

## 2017-07-07 DIAGNOSIS — I10 Essential (primary) hypertension: Secondary | ICD-10-CM

## 2017-07-07 MED ORDER — LISINOPRIL 40 MG PO TABS: 40.0000 mg | ORAL_TABLET | Freq: Every day | ORAL | 1 refills | Status: DC

## 2017-07-07 MED ORDER — AMLODIPINE BESYLATE 10 MG PO TABS: ORAL_TABLET | ORAL | 3 refills | Status: DC

## 2017-07-07 MED ORDER — GLIMEPIRIDE 1 MG PO TABS: 3.0000 mg | ORAL_TABLET | Freq: Every day | ORAL | 0 refills | Status: DC

## 2017-07-07 MED ORDER — METOPROLOL TARTRATE 100 MG PO TABS: 100.0000 mg | ORAL_TABLET | Freq: Two times a day (BID) | ORAL | 1 refills | Status: DC

## 2017-07-07 NOTE — Telephone Encounter (Signed)
Rx refilled.

## 2017-07-07 NOTE — Telephone Encounter (Signed)
Pt called and said he cant get his meds until he is seen by the dr again. He is completely out of metoprolol, glimepiride, amlodipine and lisinopril. Earliest app is Friday November 16. He would like a refill on his meds to last him until he comes for his app on 11/16.

## 2017-07-22 ENCOUNTER — Encounter: Payer: Self-pay | Admitting: Family Medicine

## 2017-07-22 ENCOUNTER — Other Ambulatory Visit: Payer: Self-pay

## 2017-07-22 ENCOUNTER — Ambulatory Visit: Payer: BLUE CROSS/BLUE SHIELD | Admitting: Family Medicine

## 2017-07-22 VITALS — BP 156/94 | HR 78 | Temp 98.6°F | Wt 234.0 lb

## 2017-07-22 DIAGNOSIS — E785 Hyperlipidemia, unspecified: Secondary | ICD-10-CM

## 2017-07-22 DIAGNOSIS — E7800 Pure hypercholesterolemia, unspecified: Secondary | ICD-10-CM

## 2017-07-22 DIAGNOSIS — N3281 Overactive bladder: Secondary | ICD-10-CM | POA: Diagnosis not present

## 2017-07-22 DIAGNOSIS — E559 Vitamin D deficiency, unspecified: Secondary | ICD-10-CM

## 2017-07-22 DIAGNOSIS — I10 Essential (primary) hypertension: Secondary | ICD-10-CM

## 2017-07-22 DIAGNOSIS — Z1159 Encounter for screening for other viral diseases: Secondary | ICD-10-CM

## 2017-07-22 DIAGNOSIS — E119 Type 2 diabetes mellitus without complications: Secondary | ICD-10-CM | POA: Diagnosis not present

## 2017-07-22 LAB — POCT GLYCOSYLATED HEMOGLOBIN (HGB A1C): HEMOGLOBIN A1C: 8.1

## 2017-07-22 MED ORDER — METOPROLOL TARTRATE 100 MG PO TABS: 200.0000 mg | ORAL_TABLET | Freq: Two times a day (BID) | ORAL | 2 refills | Status: DC

## 2017-07-22 MED ORDER — AMLODIPINE BESYLATE 10 MG PO TABS: ORAL_TABLET | ORAL | 2 refills | Status: DC

## 2017-07-22 MED ORDER — SIMVASTATIN 20 MG PO TABS: ORAL_TABLET | ORAL | 3 refills | Status: DC

## 2017-07-22 MED ORDER — SITAGLIPTIN PHOSPHATE 100 MG PO TABS: 100.0000 mg | ORAL_TABLET | Freq: Every day | ORAL | 3 refills | Status: DC

## 2017-07-22 MED ORDER — CLONIDINE HCL 0.1 MG PO TABS: 0.1000 mg | ORAL_TABLET | Freq: Two times a day (BID) | ORAL | 0 refills | Status: DC

## 2017-07-22 NOTE — Progress Notes (Signed)
81 

## 2017-07-22 NOTE — Patient Instructions (Signed)
It was good to see you today!  For your diabetes, - continue glimepiride  - start sitagliptin 100mg  daily - eye referral placed today, if have not heard back after 2 weeks then give us a gentle reminder to look into it  For your blood pressure - continue lisinopril, amlodipine, metoprolol - start clonidine 0.1mg  twice a day  For your cholesterol - restart simvastatin   We are checking some labs today. If results require attention, either myself or my nurse will get in touch with you. If everything is normal, you will get a letter in the mail or a message in My Chart. Please give us a call if you do not hear from us after 2 weeks.    Please check-out at the front desk before leaving the clinic. Make an appointment in  1 month with pharm clinic and me in 3 months.   Please bring all of your medications with you to each visit.   Sign up for My Chart to have easy access to your labs results, and communication with your primary care physician.  Feel free to call with any questions or concerns at any time, at [phone].   Take care,  Dr. Leland HerElsia J Fontaine Kossman, DO [facility]

## 2017-07-22 NOTE — Assessment & Plan Note (Signed)
Uncontrolled but appears to actually been on only 3 medications at home. Continue lisinopril 40mg  qd, amlodipine 10mg  qd, metoprolol 200mg  BID. Add clonidine 0.1mg  BID today. Check BMP today. Follow up in pharm clinic in 1 month and with me in 3 months.

## 2017-07-22 NOTE — Assessment & Plan Note (Signed)
A1c continues to increase from 7.3 > 7.7 > 8.1 today. Long discussion today about diet and various medications patient has tried in the past. Doing well on glimepiride but concern that he will soon need insulin if continues in this direction. Patient has not been able to tolerate metformin or jardiance in the past. Is very resistant to any injections and is also will be facing difficulty with insurance after the new year as medication coverage will be changing. Will add sitagliptin 100mg  qd today. Follow up in 1 month at pharm clinic and with me in 3 months. Referral to optho placed today for diabetic eye exam

## 2017-07-22 NOTE — Progress Notes (Signed)
Subjective:  Jeffrey Stanton is a 61 y.o. male who [facility] today for DM and HTN follow Avon Gullyup  HPI: HYPERTENSION  Disease Monitoring: does not monitor at home  Medications: has brought his home medications with him today. Is actually on metoprolol 200mg  BID, amlodipine 10mg  qd and states is also on lisinopril 40mg  qd. Recalls being on HCTZ but stopped it due to it making him urinate a lot though he does say that he was diagnosed with overactive bladder by a urologist years ago. He does not want to retrial HCTZ or another other diuretic. He also acknowledges that overactive bladder may actually be the cause of his frequent urination. Compliance- good  Chest pain- no      Dyspnea- no Lightheadedness- no   Edema- no      DIABETES  Disease Monitoring: does not monitor at home, was unable to get into pharm clinic to learn how to use glucometer due to phone issues at our clinic New Visual problems- no  Medications: on glimepiride 3mg  daily and tolerating well, has not any any episodes of feeling bad on it. No shakes or acute vision changes or sweats. Not willing to retrial metformin or jardiance. Not willing to try any injectables. Compliance- no Hypoglycemic symptoms- no Diet: has not been doing well, admit to eating lots of food he knows is not good for diabetic diet. States particular weakness is [employer] ice cream bars which he had recently bought in bulk. He states he does not have anymore and is now serious about cutting down on sweets.     HYPERLIPIDEMIA  Disease Monitoring  See symptoms for Hypertension  Medications: recalls being on a statin and tolerating well but has been off for some time. Amenable to restarting. Already ate today Compliance- no RUQ pain- no  Muscle aches- no    ROS: Per HPI  PMH: Smoking history reviewed.   he reports that  has never smoked. he has never used smokeless tobacco. His alcohol and drug histories are not on file.   Objective:    Physical Exam: BP (!) 158/96   Pulse 78   Temp 98.6 F (37 C) (Oral)   Wt 234 lb (106.1 kg)   SpO2 98%   BMI 33.58 kg/m  BP recheck 156/94 Gen: NAD, resting comfortably CV: RRR with no murmurs appreciated Pulm: NWOB, CTAB with no crackles, wheezes, or rhonchi GI: Normal bowel sounds present. Soft, Nontender, Nondistended. MSK: no edema, cyanosis, or clubbing noted Skin: warm, dry Neuro: grossly normal, moves all extremities Psych: Normal affect and thought content  Results for orders placed or performed in visit on 07/22/17 (from the past 72 hour(s))  HgB A1c     Status: Abnormal   Collection Time: 07/22/17  8:56 AM  Result Value Ref Range   Hemoglobin A1C 8.1      Assessment/Plan:  Essential hypertension Uncontrolled but appears to actually been on only 3 medications at home. Continue lisinopril 40mg  qd, amlodipine 10mg  qd, metoprolol 200mg  BID. Add clonidine 0.1mg  BID today. Check BMP today. Follow up in pharm clinic in 1 month and with me in 3 months.  T2DM (type 2 diabetes mellitus) (HCC) A1c continues to increase from 7.3 > 7.7 > 8.1 today. Long discussion today about diet and various medications patient has tried in the past. Doing well on glimepiride but concern that he will soon need insulin if continues in this direction. Patient has not been able to tolerate metformin or jardiance in  the past. Is very resistant to any injections and is also will be facing difficulty with insurance after the new year as medication coverage will be changing. Will add sitagliptin 100mg  qd today. Follow up in 1 month at pharm clinic and with me in 3 months. Referral to optho placed today for diabetic eye exam  HYPERCHOLESTEROLEMIA Restart simvastatin 20mg  qd. Cannot recheck cholesterol today as patient is not fasting.   Leland HerElsia J Kholton Coate, DO PGY-2, [facility] 07/22/2017 9:27 AM

## 2017-07-22 NOTE — Assessment & Plan Note (Signed)
Restart simvastatin 20mg  qd. Cannot recheck cholesterol today as patient is not fasting.

## 2017-07-23 LAB — BASIC METABOLIC PANEL
BUN/Creatinine Ratio: 18 (ref 10–24)
BUN: 15 mg/dL (ref 8–27)
CO2: 27 mmol/L (ref 20–29)
CREATININE: 0.84 mg/dL (ref 0.76–1.27)
Calcium: 9.5 mg/dL (ref 8.6–10.2)
Chloride: 100 mmol/L (ref 96–106)
GFR calc Af Amer: 110 mL/min/{1.73_m2} (ref >59)
GFR calc non Af Amer: 95 mL/min/{1.73_m2} (ref >59)
GLUCOSE: 259 mg/dL — AB (ref 65–99)
Potassium: 3.4 mmol/L — ABNORMAL LOW (ref 3.5–5.2)
Sodium: 142 mmol/L (ref 134–144)

## 2017-07-23 LAB — VITAMIN D 25 HYDROXY (VIT D DEFICIENCY, FRACTURES): Vit D, 25-Hydroxy: 12.2 ng/mL — ABNORMAL LOW (ref 30.0–100.0)

## 2017-07-23 LAB — HEPATITIS C ANTIBODY

## 2017-08-18 ENCOUNTER — Encounter: Payer: Self-pay | Admitting: Pharmacist

## 2017-08-18 ENCOUNTER — Ambulatory Visit: Payer: BLUE CROSS/BLUE SHIELD | Admitting: Pharmacist

## 2017-08-18 DIAGNOSIS — I10 Essential (primary) hypertension: Secondary | ICD-10-CM | POA: Diagnosis not present

## 2017-08-18 DIAGNOSIS — E7800 Pure hypercholesterolemia, unspecified: Secondary | ICD-10-CM

## 2017-08-18 DIAGNOSIS — E119 Type 2 diabetes mellitus without complications: Secondary | ICD-10-CM | POA: Diagnosis not present

## 2017-08-18 MED ORDER — SITAGLIPTIN PHOSPHATE 100 MG PO TABS: 100.0000 mg | ORAL_TABLET | Freq: Every day | ORAL | 3 refills | Status: DC

## 2017-08-18 MED ORDER — SPIRONOLACTONE 25 MG PO TABS: 12.5000 mg | ORAL_TABLET | Freq: Every day | ORAL | 1 refills | Status: DC

## 2017-08-18 MED ORDER — MICROLET NEXT LANCING DEVICE MISC: 1.0000 | Freq: Once | 0 refills | Status: AC

## 2017-08-18 MED ORDER — BAYER CONTOUR MONITOR DEVI: 1.0000 | Freq: Once | 0 refills | Status: AC

## 2017-08-18 MED ORDER — MICROLET LANCETS MISC: 1.0000 | Freq: Every day | 11 refills | Status: AC

## 2017-08-18 MED ORDER — GLUCOSE BLOOD VI STRP: ORAL_STRIP | 12 refills | Status: DC

## 2017-08-18 NOTE — Progress Notes (Signed)
Patient ID: Jeffrey Stanton, male   DOB: Feb 28, 1956, 61 y.o.   MRN: [id number] Reviewed: Agree with Dr. Macky LowerKoval's documentation and management.

## 2017-08-18 NOTE — Progress Notes (Signed)
S:     Chief Complaint  Patient presents with  . Medication Management    diabetes, htn    Patient arrives in good spirits, ambulating without assistance. Presents for diabetes and blood pressure evaluation, education, and management at the request of Dr. Artist PaisYoo. Patient was referred on 07/22/2017.  Patient was last seen by Primary Care Provider on 07/22/2017. At that time, simvastatin was restarted, sitagliptin was initiated, and clonidine was initiated.   Today, patient reports that he is doing well but he has stopped all three of the medications that were initiated at last visit with Dr. Artist PaisYoo after experiencing an unremitting headache x5 days. Headache resolved after cessation of simvastatin, clonidine, and sitagliptin. He reports he does not measure his BP or BG at home as his monitor and meter both stopped working correctly. Reports metformin related diarrhea leading to discontinuation.  Added this to "allergy/intolerance list". Patient unsure of intolerance to Jardiance. Reports that his job limits his ability to take bathroom breaks which makes diuretic therapy less than ideal. He cannot remember the types of diuretics he has tried in the past. Hesitant to start injectable therapy as he doesn't envision himself as a person that takes injections daily.  Believes he has had high blood pressure since a high school sports physical - taken medications for a long time.   Patient reports Diabetes was diagnosed many years ago, unsure how many years he has been diagnosed. Has taken medication for diabetes for many years.   Reports both mother and grandmother had diabetes, mother on insulin  Patient reports adherence with medications at a "grade of a B" using individual dose cups to keep meds organized Current diabetes medications include: glimepiride 3 mg once daily Current hypertension medications include: lisinopril 40 mg daily (unsure if taking in AM or PM), metoprolol (unsure of dose) twice  daily, amlodipine 10 mg daily  Patient denies hypoglycemic events but does not measure his blood sugar.   Patient reported dietary habits: Eats 3 meals/day Breakfast:  Sacrambled eggs and sausage Lunch:"leftovers" Dinner:eats a variety of meals (rarely eats out) [employer] or subway rarely Snacks:fresh fruit, oatmeal cookie, and occasionally a "little debbies" snack Drinks:Water and no sugar "Marianne SofiaArnold Palmer" - home made with [employer].  Patient reported exercise habits:  Limited to walking his dog which is less exercise than he admits is ideal.     Patient reports nocturia. Two - Four times at night (some symptoms of BPH-hesitancy, feeling of incomplete voiding). Patient reports neuropathy. Educated on neuropathy. Some symptoms of tingling and numbness.  Patient denies significant visual changes.  Has not had eye exam recently.  Patient denies self foot exams.    O:  Physical Exam  Constitutional: He appears well-developed and well-nourished.  Vitals reviewed.    Review of Systems  Constitutional: Negative.   All other systems reviewed and are negative.    Lab Results  Component Value Date   HGBA1C 8.1 07/22/2017   Vitals:   08/18/17 0909 08/18/17 0932  BP: (!) 190/105 (!) 186/88  Pulse: 71   SpO2: 98%     Home CBGs: NOT checking - meter has been dysfunctional for "awhile".  10 year ASCVD risk: unable to calculate without recent lipid (>1 year)  A/P: Diabetes longstanding currently with rising A1C despite use of glimepiride. History of GI intolerance with metformin. Patient denies hypoglycemic events and is able to verbalize appropriate hypoglycemia management plan. Patient reports adherence with glimepiride however did not take sitagliptin due to  headache intolerance after starting 3 medications at last visit (simvastatin, sitagliptin and clonidine (clonidine most likely the cause of headache)).  Willing to re-try the sitagliptin 100mg  daily .  Ordered new CBG  monitoring device. Asked to check CBGs twice daily.  Encouraged walking 10 minutes 5x per week - patient agreed to walking plan.   Longstanding history of hypertension requiring multidrug therapy.  Remains elevated despite use of lisinopril 40mg , amlodipine 10mg  and metoprolol 100mg  BID.  (Stopped clonidine due to headache).  Initiated spironolactone 12.5mg  (half-table) once daily. Asked patient to move lisinopril to nighttime dosing. BMET in 1-2 weeks.   ASCVD risk greater than 7.5%. Continued Aspirin 81 mg and Discontinued simvastatin due to drug intolerance with multi-drug regimen.  Suggest high intensity statin at an upcoming visit.   Written patient instructions provided.  Total time in face to face counseling 40 minutes.   Follow up in Pharmacist Clinic Visit 1 week for BP recheck and repeat BMET.   Patient seen with Devota Pacearoline Welles, PharmD, PGY2 Resident.

## 2017-08-18 NOTE — Assessment & Plan Note (Signed)
Diabetes longstanding currently with rising A1C despite use of glimepiride. History of GI intolerance with metformin. Patient denies hypoglycemic events and is able to verbalize appropriate hypoglycemia management plan. Patient reports adherence with glimepiride however did not take sitagliptin due to headache intolerance after starting 3 medications at last visit (simvastatin, sitagliptin and clonidine (clonidine most likely the cause of headache)).  Willing to re-try the sitagliptin 100mg  daily .  Ordered new CBG monitoring device. Asked to check CBGs twice daily.  Encouraged walking 10 minutes 5x per week - patient agreed to walking plan.

## 2017-08-18 NOTE — Assessment & Plan Note (Signed)
ASCVD risk greater than 7.5%. Continued Aspirin 81 mg and Discontinued simvastatin due to drug intolerance with multi-drug regimen.  Suggest high intensity statin at an upcoming visit.

## 2017-08-18 NOTE — Patient Instructions (Addendum)
Thanks for coming to see us today!   Start spironolactone 12.5 mg once a day. Take this in the mornings.  After ~3-4 days of taking spironolactone, start back on the sitagliptin 100 mg once a day  Move your lisinopril to nighttime dosing.   Do not restart the simvastatin or clonidine. We are discontinuing these.   Start checking your blood sugar first thing in the morning before food and 2hrs after your largest meal.   Come back to see us in ~1-2 weeks for blood work and blood pressure checks. Bring your blood sugar meter and all medications with you to your next visit.

## 2017-08-18 NOTE — Assessment & Plan Note (Signed)
Longstanding history of hypertension requiring multidrug therapy.  Remains elevated despite use of lisinopril 40mg , amlodipine 10mg  and metoprolol 100mg  BID.  (Stopped clonidine due to headache).  Initiated spironolactone 12.5mg  (half-table) once daily. Asked patient to move lisinopril to nighttime dosing. BMET in 1-2 weeks.

## 2017-08-20 ENCOUNTER — Other Ambulatory Visit: Payer: Self-pay | Admitting: Family Medicine

## 2017-08-20 DIAGNOSIS — E119 Type 2 diabetes mellitus without complications: Secondary | ICD-10-CM

## 2017-08-25 ENCOUNTER — Ambulatory Visit: Payer: BLUE CROSS/BLUE SHIELD | Admitting: Pharmacist

## 2017-09-28 ENCOUNTER — Other Ambulatory Visit: Payer: Self-pay | Admitting: Family Medicine

## 2017-09-28 DIAGNOSIS — E119 Type 2 diabetes mellitus without complications: Secondary | ICD-10-CM

## 2017-09-28 NOTE — Telephone Encounter (Signed)
Rx refilled but due for appointment for DM follow up and BP recheck.

## 2017-09-28 NOTE — Telephone Encounter (Signed)
LM for patient to call back and make an appt to follow up on his diabetes and blood pressure. Jazmin Hartsell,CMA

## 2017-09-30 NOTE — Telephone Encounter (Signed)
Returned patient's call from nurse line and had to leave him another message.  Asked him to call back and make a diabetes follow up appt. Jazmin Hartsell,CMA

## 2017-09-30 NOTE — Telephone Encounter (Signed)
Patient left message on nurse line stating that his new insurance will not cover glimepiride. They want him to take metformin which he cannot tolerate. Has been out of glimepiride for 4 days now. Have not received PA from Pharmacy. Will call pharmacy Monday to initiate PA. Kinnie FeilL. Vesna Kable, RN, BSN

## 2017-10-03 NOTE — Telephone Encounter (Signed)
Attempted to contact in order to evaluate if CBGs were improved or similar to past.  Also to see if Jeffrey Stanton is currently being taken.  No answer - left message to have patient return call to discuss current treatment and control.   If CBGs remain uncontrolled suggest we consider increase in glimepiride to 4mg  daily (one pill).   I did not send an new Rx.

## 2017-10-03 NOTE — Telephone Encounter (Signed)
Contacted [facility]. [employer] is rejecting glimepiride due to 3 MG being an atypical dose. Will forward to PCP and Pharm D for review. Kinnie FeilL. Issis Lindseth, RN, BSN

## 2017-10-05 ENCOUNTER — Telehealth: Payer: Self-pay | Admitting: *Deleted

## 2017-10-05 NOTE — Telephone Encounter (Signed)
[facility] from [facility] for [facility] on glimepiride. Started thru covermymeds.  Approvedtoday Questionnaire submitted. PA Case [id number] Status: Approved. Authorization and Notifications Completed.  Pharmacy informed. Emanuele Mcwhirter, Maryjo RochesterJessica Dawn, CMA

## 2017-10-25 ENCOUNTER — Other Ambulatory Visit: Payer: Self-pay | Admitting: Family Medicine

## 2017-10-25 DIAGNOSIS — E119 Type 2 diabetes mellitus without complications: Secondary | ICD-10-CM

## 2017-10-25 NOTE — Telephone Encounter (Signed)
LM for patient to call back and schedule this appt with Dr. Raymondo BandKoval. Nolon StallsJazmin [facility],CMA

## 2017-10-25 NOTE — Telephone Encounter (Signed)
Needs pharm clinic follow up for diabetes and BP

## 2017-12-02 ENCOUNTER — Other Ambulatory Visit: Payer: Self-pay | Admitting: Family Medicine

## 2017-12-02 DIAGNOSIS — E119 Type 2 diabetes mellitus without complications: Secondary | ICD-10-CM

## 2017-12-02 NOTE — Telephone Encounter (Signed)
LM for patient to call back and schedule an appointment.  Jazmin Hartsell,CMA  

## 2017-12-02 NOTE — Telephone Encounter (Signed)
Due for diabetes follow up with either me or pharm clinic

## 2018-01-16 ENCOUNTER — Other Ambulatory Visit: Payer: Self-pay | Admitting: Family Medicine

## 2018-01-16 DIAGNOSIS — E119 Type 2 diabetes mellitus without complications: Secondary | ICD-10-CM

## 2018-02-16 ENCOUNTER — Other Ambulatory Visit: Payer: Self-pay | Admitting: Family Medicine

## 2018-02-16 DIAGNOSIS — E119 Type 2 diabetes mellitus without complications: Secondary | ICD-10-CM

## 2018-02-17 NOTE — Telephone Encounter (Signed)
Due for appointment. 30d refill given to allow for this.

## 2018-02-20 NOTE — Telephone Encounter (Signed)
VM left for pt to call the office to schedule an apt with pcp for any additional refills. Please assist him in scheduling when he calls back.

## 2018-04-07 ENCOUNTER — Other Ambulatory Visit: Payer: Self-pay | Admitting: Family Medicine

## 2018-04-07 DIAGNOSIS — E119 Type 2 diabetes mellitus without complications: Secondary | ICD-10-CM

## 2018-04-20 ENCOUNTER — Encounter: Payer: Self-pay | Admitting: Family Medicine

## 2018-04-20 ENCOUNTER — Other Ambulatory Visit: Payer: Self-pay

## 2018-04-20 ENCOUNTER — Ambulatory Visit (INDEPENDENT_AMBULATORY_CARE_PROVIDER_SITE_OTHER): Payer: BLUE CROSS/BLUE SHIELD | Admitting: Family Medicine

## 2018-04-20 VITALS — BP 165/90 | HR 74 | Temp 98.8°F | Wt 230.0 lb

## 2018-04-20 DIAGNOSIS — I10 Essential (primary) hypertension: Secondary | ICD-10-CM | POA: Diagnosis not present

## 2018-04-20 DIAGNOSIS — M222X2 Patellofemoral disorders, left knee: Secondary | ICD-10-CM | POA: Insufficient documentation

## 2018-04-20 DIAGNOSIS — Z0100 Encounter for examination of eyes and vision without abnormal findings: Secondary | ICD-10-CM

## 2018-04-20 DIAGNOSIS — E119 Type 2 diabetes mellitus without complications: Secondary | ICD-10-CM

## 2018-04-20 LAB — POCT GLYCOSYLATED HEMOGLOBIN (HGB A1C): HBA1C, POC (CONTROLLED DIABETIC RANGE): 8.2 % — AB (ref 0.0–7.0)

## 2018-04-20 MED ORDER — SPIRONOLACTONE 25 MG PO TABS
25.0000 mg | ORAL_TABLET | Freq: Every day | ORAL | 1 refills | Status: DC
Start: 2018-04-20 — End: 2018-07-28

## 2018-04-20 MED ORDER — LISINOPRIL 40 MG PO TABS: 40.0000 mg | ORAL_TABLET | Freq: Every day | ORAL | 1 refills | Status: DC

## 2018-04-20 NOTE — Assessment & Plan Note (Signed)
Uncontrolled.  Continue metoprolol tartrate 100 mg twice a day, lisinopril 40 mg daily, amlodipine 10 mg daily.  Start spironolactone 25 mg daily.  Follow-up in 1 month and check  BMP at that time.

## 2018-04-20 NOTE — Assessment & Plan Note (Signed)
Patient has anterior knee pain worse with going up and down steps.  This is most consistent with patellofemoral syndrome.  Patient does not have any ligamentous laxity or red flags on history or exam.  Given exercises.

## 2018-04-20 NOTE — Patient Instructions (Addendum)
For your blood pressure, start spironolactone 1 pill daily. Follow up in 1 month. We will do blood work at that time. If you can be fasting for 8 hours before, please try.     Patellofemoral Pain Syndrome Patellofemoral pain syndrome is a condition that involves a softening or breakdown of the tissue (cartilage) on the underside of your kneecap (patella). This causes pain in the front of the knee. The condition is also called runner's knee or chondromalacia patella. Patellofemoral pain syndrome is most common in young adults who are active in sports. Your knee is the largest joint in your body. The patella covers the front of your knee and is attached to muscles above and below your knee. The underside of the patella is covered with a smooth type of cartilage (synovium). The smooth surface helps the patella glide easily when you move your knee. Patellofemoral pain syndrome causes swelling in the joint linings and bone surfaces in your knee. What are the causes? Patellofemoral pain syndrome can be caused by:  Overuse.  Poor alignment of your knee joints.  Weak leg muscles.  A direct blow to your kneecap.  What increases the risk? You may be at risk for patellofemoral pain syndrome if you:  Do a lot of activities that can wear down your kneecap. These include: ? Running. ? Squatting. ? Climbing stairs.  Start a new physical activity or exercise program.  Wear shoes that do not fit well.  Do not have good leg strength.  Are overweight.  What are the signs or symptoms? Knee pain is the most common symptom of patellofemoral pain syndrome. This may feel like a dull, aching pain underneath your patella, in the front of your knee. There may be a popping or cracking sound when you move your knee. Pain may get worse with:  Exercise.  Climbing stairs.  Running.  Jumping.  Squatting.  Kneeling.  Sitting for a long time.  Moving or pushing on your patella.  How is this  diagnosed? Your health care provider may be able to diagnose patellofemoral pain syndrome from your symptoms and medical history. You may be asked about your recent physical activities and which ones cause knee pain. Your health care provider may do a physical exam with certain tests to confirm the diagnosis. These may include:  Moving your patella back and forth.  Checking your range of knee motion.  Having you squat or jump to see if you have pain.  Checking the strength of your leg muscles.  An MRI of the knee may also be done. How is this treated? Patellofemoral pain syndrome can usually be treated at home with rest, ice, compression, and elevation (RICE). Other treatments may include:  Nonsteroidal anti-inflammatory drugs (NSAIDs).  Physical therapy to stretch and strengthen your leg muscles.  Shoe inserts (orthotics) to take stress off your knee.  A knee brace or knee support.  Surgery to remove damaged cartilage or move the patella to a better position. The need for surgery is rare.  Follow these instructions at home:  Take medicines only as directed by your health care provider.  Rest your knee. ? When resting, keep your knee raised above the level of your heart. ? Avoid activities that cause knee pain.  Apply ice to the injured area: ? Put ice in a plastic bag. ? Place a towel between your skin and the bag. ? Leave the ice on for 20 minutes, 2-3 times a day.  Use splints, braces, knee supports,  or walking aids as directed by your health care provider.  Perform stretching and strengthening exercises as directed by your health care provider or physical therapist.  Keep all follow-up visits as directed by your health care provider. This is important. Contact a health care provider if:  Your symptoms get worse.  You are not improving with home care. This information is not intended to replace advice given to you by your health care provider. Make sure you discuss  any questions you have with your health care provider. Document Released: 08/11/2009 Document Revised: 01/29/2016 Document Reviewed: 11/12/2013 Elsevier Interactive Patient Education  2018 [employer]

## 2018-04-20 NOTE — Progress Notes (Signed)
Subjective:  Jeffrey Stanton is a 62 y.o. male [facility] and chief complaint of L leg pain  HPI:  L leg pain Gradual onset, progressively worsening over the last 1 month. Started off tighness in the calf, was trying to stretch several times a day. Then started to get tightness in hamstring. Then starting feeling pain in front of knee which where it is the worst. Feels like sharp stabbing pain "like a hammer"  In the front of his medial knee. Going up and down steps is the worst, feels in the front of knee.  No popping or cracking sensation.   Hypertension Does intermittently check his blood sugars at home where he states that his readings have been much lower than his office readings.  He is not sure if his home blood pressure machine is accurate.   States that he has been compliant on his home medications including metoprolol tartrate 100 mg twice a day, lisinopril 40 mg daily, amlodipine 10 mg daily.  He was supposed to start spironolactone 12.5 mg daily however he says he was not successful and splitting the pills in half despite the use of a pill cutter so he did not start this medication.  He is amenable to trying the full tablet of 25 mg daily. ROS: no medication side effects noted, no TIA's, no chest pain on exertion, no dyspnea on exertion and no swelling of ankles.   Diabetes He states that he tried sitagliptin again however when he restarted the medication he began to have headaches.  He is not interested in re-trialing any additional oral medications at this time as he has now failed metformin, Jardiance, sitagliptin, Actos.  He is not interested in any injectable medications.   He does check his blood sugars at home and says that they have been recently elevated into the high 200s to 300s but that was in the setting of not getting his glimepiride refilled at his pharmacy.  Since restarting the glimepiride he says his sugars have more been in the  140s to 160s. No polyuria or polydipsia   ROS: Per HPI  PMH: He reports that he has never smoked. He has never used smokeless tobacco. His alcohol and drug histories are not on file.   Objective:  Physical Exam: BP (!) 165/90   Pulse 74   Temp 98.8 F (37.1 C) (Oral)   Wt 230 lb (104.3 kg)   SpO2 97%   BMI 33.00 kg/m   Gen: NAD, resting comfortably Pulm: NWOB on room air MSK: No ligamentous laxity of the left knee.  No tenderness on palpation.  Normal active range of motion.  Negative anterior posterior drawer test  Skin: warm, dry Neuro: grossly normal, moves all extremities Psych: Normal affect and thought content  Results for orders placed or performed in visit on 04/20/18 (from the past 72 hour(s))  HgB A1c     Status: Abnormal   Collection Time: 04/20/18  4:07 PM  Result Value Ref Range   Hemoglobin A1C     HbA1c POC (<> result, manual entry)     HbA1c, POC (prediabetic range)     HbA1c, POC (controlled diabetic range) 8.2 (A) 0.0 - 7.0 %     Assessment/Plan:  Essential hypertension Uncontrolled.  Continue metoprolol tartrate 100 mg twice a day, lisinopril 40 mg daily, amlodipine 10 mg daily.  Start spironolactone 25 mg daily.  Follow-up in 1 month and check  BMP  at that time.  T2DM (type 2 diabetes mellitus) (HCC) Uncontrolled.  Patient is unwilling to make any medication changes.  He would like to stay on his glimepiride and is hopeful that his A1c will improve with good medication compliance.  Discussed that this is unlikely and that I am recommending additional medication for better diabetes control.  Patient voiced good understanding and will consider this over the next month.  Patellofemoral syndrome, left Patient has anterior knee pain worse with going up and down steps.  This is most consistent with patellofemoral syndrome.  Patient does not have any ligamentous laxity or red flags on history or exam.  Given exercises.   Leland HerElsia J Yoo, DO PGY-3, [facility] 04/20/2018 4:17 PM

## 2018-04-20 NOTE — Assessment & Plan Note (Signed)
Uncontrolled.  Patient is unwilling to make any medication changes.  He would like to stay on his glimepiride and is hopeful that his A1c will improve with good medication compliance.  Discussed that this is unlikely and that I am recommending additional medication for better diabetes control.  Patient voiced good understanding and will consider this over the next month.

## 2018-05-15 ENCOUNTER — Other Ambulatory Visit: Payer: Self-pay | Admitting: Family Medicine

## 2018-05-15 DIAGNOSIS — E119 Type 2 diabetes mellitus without complications: Secondary | ICD-10-CM

## 2018-06-29 ENCOUNTER — Ambulatory Visit: Payer: BLUE CROSS/BLUE SHIELD | Admitting: Family Medicine

## 2018-07-14 ENCOUNTER — Other Ambulatory Visit: Payer: Self-pay | Admitting: Family Medicine

## 2018-07-14 DIAGNOSIS — I10 Essential (primary) hypertension: Secondary | ICD-10-CM

## 2018-07-28 ENCOUNTER — Other Ambulatory Visit: Payer: Self-pay | Admitting: Family Medicine

## 2018-07-28 DIAGNOSIS — I10 Essential (primary) hypertension: Secondary | ICD-10-CM

## 2018-08-01 ENCOUNTER — Other Ambulatory Visit: Payer: Self-pay | Admitting: Family Medicine

## 2018-08-01 DIAGNOSIS — E785 Hyperlipidemia, unspecified: Secondary | ICD-10-CM

## 2018-08-02 NOTE — Telephone Encounter (Signed)
Per Dr. Macky LowerKoval's note, was discontinued due to side effects. Was recommended that patient return was discussion of initiation of high intensity statin

## 2018-08-07 NOTE — Telephone Encounter (Signed)
LM for patient to call back to make an appt to discuss cholesterol medication.   ,CMA

## 2018-08-10 ENCOUNTER — Other Ambulatory Visit: Payer: Self-pay | Admitting: Family Medicine

## 2018-08-10 DIAGNOSIS — I10 Essential (primary) hypertension: Secondary | ICD-10-CM

## 2018-08-11 NOTE — Telephone Encounter (Signed)
LVM for patient to call and schedule for any additional refills.

## 2018-08-11 NOTE — Telephone Encounter (Signed)
Needs appt. Can patient please be called to schedule. 

## 2018-11-02 ENCOUNTER — Telehealth: Payer: Self-pay | Admitting: Family Medicine

## 2018-11-02 DIAGNOSIS — E119 Type 2 diabetes mellitus without complications: Secondary | ICD-10-CM

## 2018-11-02 NOTE — Telephone Encounter (Signed)
Needs appt. 30d supply given.

## 2018-11-02 NOTE — Telephone Encounter (Signed)
LM for patient to call back and make an appointment to discuss his diabetes.  Jazmin Hartsell,CMA

## 2018-11-07 NOTE — Telephone Encounter (Signed)
Can patient please be scheduled for an annual wellness visit. We will also address his diabetes at that time.

## 2018-11-07 NOTE — Telephone Encounter (Signed)
Pt called nurse line line returning phone call from last week. I informed pt a 30 day supply was given for his diabetes medication, and for any additional refills he will have to be seen clinic. Pt states with his insurance ([employer]) he can not afford to come in, unless its billed as an annual physical. Pt did state his sugars have been the 250s-260s range. Will forward to MD to advise.

## 2018-11-08 NOTE — Telephone Encounter (Signed)
LM for patient to call back and schedule his annual physical.  Jazmin Hartsell,CMA

## 2018-12-13 ENCOUNTER — Other Ambulatory Visit: Payer: Self-pay | Admitting: Family Medicine

## 2018-12-20 ENCOUNTER — Other Ambulatory Visit: Payer: Self-pay | Admitting: Family Medicine

## 2018-12-20 DIAGNOSIS — E119 Type 2 diabetes mellitus without complications: Secondary | ICD-10-CM

## 2018-12-20 DIAGNOSIS — I10 Essential (primary) hypertension: Secondary | ICD-10-CM

## 2018-12-21 NOTE — Telephone Encounter (Signed)
Needs a1c recheck when need for social distancing is over

## 2018-12-28 ENCOUNTER — Other Ambulatory Visit: Payer: Self-pay | Admitting: Family Medicine

## 2018-12-28 DIAGNOSIS — I10 Essential (primary) hypertension: Secondary | ICD-10-CM

## 2018-12-28 NOTE — Telephone Encounter (Signed)
Patient needs BMP as soon as need for social distancing is over. Please inform. 30d supply given.

## 2019-01-10 ENCOUNTER — Other Ambulatory Visit: Payer: Self-pay | Admitting: *Deleted

## 2019-01-10 DIAGNOSIS — E119 Type 2 diabetes mellitus without complications: Secondary | ICD-10-CM

## 2019-01-10 MED ORDER — GLIMEPIRIDE 1 MG PO TABS: ORAL_TABLET | ORAL | 3 refills | Status: DC

## 2019-01-10 NOTE — Telephone Encounter (Signed)
Pt has virtual visit with [facility] on 01/19/19.  Jone Baseman, CMA

## 2019-01-19 ENCOUNTER — Other Ambulatory Visit: Payer: Self-pay | Admitting: Family Medicine

## 2019-01-19 ENCOUNTER — Other Ambulatory Visit: Payer: Self-pay

## 2019-01-19 ENCOUNTER — Telehealth (INDEPENDENT_AMBULATORY_CARE_PROVIDER_SITE_OTHER): Payer: BLUE CROSS/BLUE SHIELD | Admitting: Family Medicine

## 2019-01-19 DIAGNOSIS — Z760 Encounter for issue of repeat prescription: Secondary | ICD-10-CM | POA: Diagnosis not present

## 2019-01-19 DIAGNOSIS — I10 Essential (primary) hypertension: Secondary | ICD-10-CM

## 2019-01-19 NOTE — Progress Notes (Signed)
[facility] Telemedicine Visit  Patient consented to have virtual visit. Method of visit: Telephone  Encounter participants: Patient: Jeffrey Stanton - located at Home Provider: Lovena Neighbours - located at [facility] Others (if applicable): None  Chief Complaint: Medication refill  HPI: Patient is a 63 year old male who is calling in today for medication refill.  Patient reports that he has no acute complaints today and was told he needed to call his PCP to have his medication refill a few days ago.  However, patient states that medications have been refilled and he does not need to talk to a physician at this time.    ROS: per HPI  Pertinent PMHx:   Exam:  Cardiac: no palpitations. Respiratory:no increase WOB Abdominal: No abdominal pain  Neuro: AOx4, no focal deficit   Assessment/Plan:  Medication refill Patient with no acute complaints today.  Initially thought he needed to talk to PCP but reports that meds have been refilled and sent to his pharmacy. He will follow-up as needed.    Time spent during visit with patient: 2 minutes

## 2019-01-19 NOTE — Assessment & Plan Note (Signed)
Patient with no acute complaints today.  Initially thought he needed to talk to PCP but reports that meds have been refilled and sent to his pharmacy. He will follow-up as needed.

## 2019-01-19 NOTE — Telephone Encounter (Signed)
Needs in person visit for DM and HTN follow up. He needs both a1c and BMP since patient has uncontrolled diabetes and is on spironolactone while already on lisinopril which means careful monitoring of potassium level is important. Please inform patient. 30 day refill given 

## 2019-01-22 NOTE — Telephone Encounter (Signed)
LVM for pt to give [place] a call back to make an in person appt. To follow up DM and HTN. Aquilla Solian, CMA

## 2019-02-02 ENCOUNTER — Other Ambulatory Visit: Payer: Self-pay | Admitting: Family Medicine

## 2019-02-02 DIAGNOSIS — I10 Essential (primary) hypertension: Secondary | ICD-10-CM

## 2019-02-02 NOTE — Telephone Encounter (Signed)
Needs in person visit for DM and HTN follow up. He needs both a1c and BMP since patient has uncontrolled diabetes and is on spironolactone while already on lisinopril which means careful monitoring of potassium level is important. Please inform patient. 30 day refill given 

## 2019-02-05 NOTE — Telephone Encounter (Signed)
LM for patient to call back and schedule an appoint before the end of June.  Rumaysa Sabatino,CMA

## 2019-02-22 ENCOUNTER — Other Ambulatory Visit: Payer: Self-pay | Admitting: Family Medicine

## 2019-02-22 DIAGNOSIS — I10 Essential (primary) hypertension: Secondary | ICD-10-CM

## 2019-02-23 NOTE — Telephone Encounter (Signed)
Needs in person visit for DM and HTN follow up. He needs both a1c and BMP since patient has uncontrolled diabetes and is on spironolactone while already on lisinopril which means careful monitoring of potassium level is important. Please inform patient. 30 day refill given

## 2019-02-26 NOTE — Telephone Encounter (Signed)
LM for patient explaining his need for an appointment and asked him to call back to schedule it.  Will try to reach again later.  Jazmin Hartsell,CMA

## 2019-03-29 ENCOUNTER — Other Ambulatory Visit: Payer: Self-pay | Admitting: *Deleted

## 2019-03-29 DIAGNOSIS — I10 Essential (primary) hypertension: Secondary | ICD-10-CM

## 2019-03-30 NOTE — Telephone Encounter (Signed)
Pt has an appt with you on the 8/14. Machaela Caterino Kennon Holter, CMA

## 2019-04-02 MED ORDER — SPIRONOLACTONE 25 MG PO TABS: 25.0000 mg | ORAL_TABLET | Freq: Every day | ORAL | 0 refills | Status: DC

## 2019-04-02 MED ORDER — AMLODIPINE BESYLATE 10 MG PO TABS: ORAL_TABLET | ORAL | 0 refills | Status: DC

## 2019-04-02 MED ORDER — LISINOPRIL 40 MG PO TABS: 40.0000 mg | ORAL_TABLET | Freq: Every day | ORAL | 0 refills | Status: DC

## 2019-04-05 ENCOUNTER — Other Ambulatory Visit: Payer: Self-pay | Admitting: *Deleted

## 2019-04-05 DIAGNOSIS — I10 Essential (primary) hypertension: Secondary | ICD-10-CM

## 2019-04-06 MED ORDER — METOPROLOL TARTRATE 100 MG PO TABS: 200.0000 mg | ORAL_TABLET | Freq: Two times a day (BID) | ORAL | 0 refills | Status: DC

## 2019-04-20 ENCOUNTER — Other Ambulatory Visit: Payer: Self-pay

## 2019-04-20 ENCOUNTER — Ambulatory Visit (INDEPENDENT_AMBULATORY_CARE_PROVIDER_SITE_OTHER): Payer: BC Managed Care – PPO | Admitting: Family Medicine

## 2019-04-20 ENCOUNTER — Encounter: Payer: Self-pay | Admitting: Family Medicine

## 2019-04-20 VITALS — BP 168/92 | HR 80 | Ht 70.0 in | Wt 235.0 lb

## 2019-04-20 DIAGNOSIS — E119 Type 2 diabetes mellitus without complications: Secondary | ICD-10-CM | POA: Diagnosis not present

## 2019-04-20 DIAGNOSIS — E785 Hyperlipidemia, unspecified: Secondary | ICD-10-CM | POA: Diagnosis not present

## 2019-04-20 DIAGNOSIS — I10 Essential (primary) hypertension: Secondary | ICD-10-CM

## 2019-04-20 DIAGNOSIS — Z0000 Encounter for general adult medical examination without abnormal findings: Secondary | ICD-10-CM

## 2019-04-20 LAB — POCT GLYCOSYLATED HEMOGLOBIN (HGB A1C): HbA1c, POC (controlled diabetic range): 8.6 % — AB (ref 0.0–7.0)

## 2019-04-20 NOTE — Patient Instructions (Addendum)
Thank you for allowing me to take part in your care today! It was a pleasure meeting with you.   Today we discussed your:   High Blood Pressure Your Blood pressure was elevated 150/100 today.   Please continue to measure your blood pressure at home and record your pressures at home.  On your next visit, please bring your blood pressure cuff and recordings with you as well as all of your medications.   Diabetes, with A1c 8.6  We discussed managing your blood sugar with diet and exercise for the next 3-4 months. We will revisit this at that time.   In the meantime, please continue taking all of your medications as prescribed.   We will complete blood work for your wellness visit.  Please return in 4 weeks for follow up.

## 2019-04-20 NOTE — Progress Notes (Signed)
Patient Name: Jeffrey Stanton Date of Birth: 09/26/1955 Date of Visit: 04/23/19 PCP: Stark Klein, MD  Chief Complaint:   Subjective: Jeffrey Stanton is a pleasant 63 y.o. with medical history significant for HTN,  presenting today for an annual wellness visit.  [name] reports no complaints today.  Patient states that he is not interested in having a colonoscopy at this time.  Today patient's blood pressure is elevated at 151/91.  Patient reports that he always has high blood pressure since he was 63 years old.  Patient denies headache, blurry vision and palpitations.  Patient reports that he has had some trouble getting his medications refilled prior to this visit but has been able to take his medications regularly for the past 2 weeks. During this visit, we discussed pneumonia vaccine to which the patient was amenable to.  We also completed a foot exam for his diabetes.  Patient was recommended to see an ophthalmologist for his diabetic eye exam when possible.  Patient expressed that he would attempt to make this appointment once his insurance verifies that they would cover the cost of the visit.  Patient is requesting lab work for his annual visit.  Patient's hemoglobin A1c is 8.6 today, patient would like to try diet and exercise management in addition to Glimepiride for his diabetes for the next 3 to 4 months.   ROS: Per HPI.   I have reviewed the patient's medical, surgical, family, and social history as appropriate.  Vitals:   04/20/19 1539 04/20/19 1638  BP: (!) 150/100 (!) 168/92  Pulse: 80   SpO2: 99%    Physical Exam  Constitutional: He is oriented to person, place, and time. He appears well-developed and well-nourished. No distress.  HENT:  Head: Normocephalic and atraumatic.  Nose: Nose normal.  Mouth/Throat: Oropharynx is clear and moist. No oropharyngeal exudate.  Eyes: Pupils are equal, round, and reactive to light. Conjunctivae and EOM are normal. Right eye exhibits no  discharge. Left eye exhibits no discharge. No scleral icterus.  Neck: Normal range of motion. Neck supple. No thyromegaly present.  Cardiovascular: Normal rate, regular rhythm and intact distal pulses. Exam reveals no gallop and no friction rub.  No murmur heard. Pulmonary/Chest: Effort normal and breath sounds normal. No stridor. No respiratory distress. He has no wheezes. He has no rales.  Abdominal: Soft. Bowel sounds are normal. He exhibits no distension. There is no abdominal tenderness.  Hernia left upper and lower quadrants, reducible  Musculoskeletal: Normal range of motion.        General: Edema present. No tenderness.  Neurological: He is alert and oriented to person, place, and time.  Skin: Skin is warm and dry.  Some scars from previous mole removals  Vitals reviewed.  DM Foot Exam:  Patient with sensation and all testing points with monofilament No ulcers, skin breakdown, erythema, bunions or calluses No bony abnormalities appreciated Palpable dorsalis pedis and posterior tibial pulses bilaterally  Essential hypertension Patient hypertensive in clinic today with BP of 151/100, on repeat 168/92. Patient reports being hypertensive. Patient currently on amlodipine, lisinopril, metoprolol and spironolactone. Patient reports that was having trouble getting refills of his prescriptions but has been taking them regularly for the past 2 weeks.  Reports that he has been hypertensive since he was 63 years old, and has no history of renal problems.  Patient denies headache or blurry vision.  Last creatinine level was 0.84 in 2018.  Discussed obtaining updated creatinine and hepatic enzyme levels to which  patient was agreeable.  Due to visit running late, will plan to get CMP at follow-up visit in 4 weeks. Patient counseled to continue home antihypertensives and start recording blood pressures at home.  Patient reports that he does not surest accuracy of his home blood pressure machine.   Patient plans to purchase a new machine prior to his next follow-up visit.  Patient counseled to continue taking his blood pressures at home and to record these for his next visit.  Patient also encouraged to bring all of his medications and blood pressure cuff as well as blood pressure measurement journal to next visit.  Patient agrees with this plan.  -Continue Amlodipine 10mg   -continue lisinopril 40 mg -continue metoprolol 100 mg twice daily -Spironolactone 25 mg -record home blood pressures for next 4 weeks -Return for visit in 4 weeks for CMP, to monitor renal and liver enzymes  T2DM (type 2 diabetes mellitus) (HCC) Patient reports compliance with home Amaryl.  Patient also reports he is recently gained weight and is working to incorporate exercise into his daily routine at work.  Patient reports walking laps around the building and using the stairs regularly.  Patient is hemoglobin A1c measured at 8.6 today.  Patient is just trying diet and exercise to decrease his hemoglobin A1c over the next 3 to 4 months.  Patient has no abnormalities on diabetic foot exam.  Patient denies peripheral neuropathy symptoms such as burning, tingling, numbness in his fingers or feet. -We will continue to monitor blood glucose sugars at home -Patient to continue glimepiride 3 mg daily -Repeat hemoglobin A1c in 3 months -Patient encouraged to schedule diabetic eye exam -Diabetic foot exam completed today  Healthcare maintenance Discussed colonoscopy with patient.  Patient denies ever having a colonoscopy.  Patient states that he does not want a colonoscopy at this time.  Also recommended that patient see ophthalmologist or optometrist for diabetes eye exam.  Patient agrees to have this done as long as his insurance will cover the visit.  Patient offered pneumococcal vaccine, patient agrees to have this vaccine.  -Offered and recommended colonoscopy, patient continues to climb -Recommended diabetic eye exam,  patient agrees to schedule visit pending insurance coverage -Diabetic foot exam completed today -We will order prescription for pneumo vaccine at follow-up visit    Return to care in 4 weeks as scheduled.   Nicki GuadalajaraMakiera Simmons, MD  [facility]   PGY-1

## 2019-04-23 ENCOUNTER — Encounter: Payer: Self-pay | Admitting: Family Medicine

## 2019-04-23 NOTE — Assessment & Plan Note (Addendum)
Discussed colonoscopy with patient.  Patient denies ever having a colonoscopy.  Patient states that he does not want a colonoscopy at this time.  Also recommended that patient see ophthalmologist or optometrist for diabetes eye exam.  Patient agrees to have this done as long as his insurance will cover the visit.  Patient offered pneumococcal vaccine, patient agrees to have this vaccine.  -Offered and recommended colonoscopy, patient continues to climb -Recommended diabetic eye exam, patient agrees to schedule visit pending insurance coverage -Diabetic foot exam completed today -We will order prescription for pneumo vaccine at follow-up visit

## 2019-04-23 NOTE — Assessment & Plan Note (Addendum)
Patient reports compliance with home Amaryl.  Patient also reports he is recently gained weight and is working to incorporate exercise into his daily routine at work.  Patient reports walking laps around the building and using the stairs regularly.  Patient is hemoglobin A1c measured at 8.6 today.  Patient is just trying diet and exercise to decrease his hemoglobin A1c over the next 3 to 4 months.  Patient has no abnormalities on diabetic foot exam.  Patient denies peripheral neuropathy symptoms such as burning, tingling, numbness in his fingers or feet. -We will continue to monitor blood glucose sugars at home -Patient to continue glimepiride 3 mg daily -Repeat hemoglobin A1c in 3 months -Patient encouraged to schedule diabetic eye exam -Diabetic foot exam completed today

## 2019-04-23 NOTE — Assessment & Plan Note (Addendum)
Patient hypertensive in clinic today with BP of 151/100, on repeat 168/92. Patient reports being hypertensive. Patient currently on amlodipine, lisinopril, metoprolol and spironolactone. Patient reports that was having trouble getting refills of his prescriptions but has been taking them regularly for the past 2 weeks.  Reports that he has been hypertensive since he was 63 years old, and has no history of renal problems.  Patient denies headache or blurry vision.  Last creatinine level was 0.84 in 2018.  Discussed obtaining updated creatinine and hepatic enzyme levels to which patient was agreeable.  Due to visit running late, will plan to get CMP at follow-up visit in 4 weeks. Patient counseled to continue home antihypertensives and start recording blood pressures at home.  Patient reports that he does not surest accuracy of his home blood pressure machine.  Patient plans to purchase a new machine prior to his next follow-up visit.  Patient counseled to continue taking his blood pressures at home and to record these for his next visit.  Patient also encouraged to bring all of his medications and blood pressure cuff as well as blood pressure measurement journal to next visit.  Patient agrees with this plan.  -Continue Amlodipine 10mg   -continue lisinopril 40 mg -continue metoprolol 100 mg twice daily -Spironolactone 25 mg -record home blood pressures for next 4 weeks -Return for visit in 4 weeks for CMP, to monitor renal and liver enzymes

## 2019-05-07 ENCOUNTER — Other Ambulatory Visit: Payer: Self-pay

## 2019-05-07 DIAGNOSIS — I10 Essential (primary) hypertension: Secondary | ICD-10-CM

## 2019-05-08 MED ORDER — AMLODIPINE BESYLATE 10 MG PO TABS: ORAL_TABLET | ORAL | 0 refills | Status: DC

## 2019-05-08 MED ORDER — LISINOPRIL 40 MG PO TABS: 40.0000 mg | ORAL_TABLET | Freq: Every day | ORAL | 0 refills | Status: DC

## 2019-05-15 ENCOUNTER — Encounter: Payer: Self-pay | Admitting: Family Medicine

## 2019-05-15 ENCOUNTER — Other Ambulatory Visit: Payer: Self-pay

## 2019-05-15 DIAGNOSIS — I10 Essential (primary) hypertension: Secondary | ICD-10-CM

## 2019-05-15 NOTE — Assessment & Plan Note (Signed)
BP measurement in office  BP measures from home recordings  -

## 2019-05-15 NOTE — Progress Notes (Signed)
  Patient left the office before being seen due to prolonged wait. Attempted to call patient and convert to telephone visit but no answer.   Jeffrey Klein, MD  [facility]   PGY-1

## 2019-05-16 ENCOUNTER — Other Ambulatory Visit: Payer: Self-pay

## 2019-05-16 ENCOUNTER — Encounter: Payer: Self-pay | Admitting: Family Medicine

## 2019-05-16 ENCOUNTER — Ambulatory Visit (INDEPENDENT_AMBULATORY_CARE_PROVIDER_SITE_OTHER): Payer: BC Managed Care – PPO | Admitting: Family Medicine

## 2019-05-16 DIAGNOSIS — I10 Essential (primary) hypertension: Secondary | ICD-10-CM

## 2019-05-16 MED ORDER — SPIRONOLACTONE 25 MG PO TABS: 25.0000 mg | ORAL_TABLET | Freq: Every day | ORAL | 3 refills | Status: DC

## 2019-05-30 ENCOUNTER — Telehealth: Payer: Self-pay | Admitting: *Deleted

## 2019-05-30 DIAGNOSIS — I10 Essential (primary) hypertension: Secondary | ICD-10-CM

## 2019-05-30 MED ORDER — LISINOPRIL 40 MG PO TABS: 40.0000 mg | ORAL_TABLET | Freq: Every day | ORAL | 0 refills | Status: DC

## 2019-05-30 MED ORDER — AMLODIPINE BESYLATE 10 MG PO TABS: ORAL_TABLET | ORAL | 0 refills | Status: DC

## 2019-05-30 MED ORDER — METOPROLOL TARTRATE 100 MG PO TABS: 200.0000 mg | ORAL_TABLET | Freq: Two times a day (BID) | ORAL | 0 refills | Status: DC

## 2019-05-30 NOTE — Telephone Encounter (Signed)
Pt calls to return call to Dr. Rosita Fire from 05/16/19.  Advised that she called to do a virtual on his BP.   Pt explains that he left because he thought he was forgotten because he did not see anyone around when he went to the restroom. Apologized for issue.   Pt is amendable to making another appt but only wants it with Dr. Rosita Fire and needs Tuesday or wednesdays.  He will call back in a couple weeks to schedule appt in November (nothing in October).  Will provide a 3 month supply of meds until then.  Christen Bame, CMA

## 2019-06-15 ENCOUNTER — Other Ambulatory Visit: Payer: Self-pay

## 2019-06-15 DIAGNOSIS — E119 Type 2 diabetes mellitus without complications: Secondary | ICD-10-CM

## 2019-06-15 MED ORDER — GLIMEPIRIDE 1 MG PO TABS: ORAL_TABLET | ORAL | 3 refills | Status: DC

## 2019-10-06 ENCOUNTER — Other Ambulatory Visit: Payer: Self-pay | Admitting: Family Medicine

## 2019-10-06 DIAGNOSIS — E119 Type 2 diabetes mellitus without complications: Secondary | ICD-10-CM

## 2019-11-05 ENCOUNTER — Other Ambulatory Visit: Payer: Self-pay | Admitting: Family Medicine

## 2019-11-05 DIAGNOSIS — I10 Essential (primary) hypertension: Secondary | ICD-10-CM

## 2019-11-15 ENCOUNTER — Other Ambulatory Visit: Payer: Self-pay | Admitting: Family Medicine

## 2019-11-15 DIAGNOSIS — I10 Essential (primary) hypertension: Secondary | ICD-10-CM

## 2019-11-16 NOTE — Telephone Encounter (Signed)
Refilled for 3 months. Patient will need an appointment for further refills.

## 2019-11-26 ENCOUNTER — Other Ambulatory Visit: Payer: Self-pay | Admitting: Family Medicine

## 2019-11-26 DIAGNOSIS — I10 Essential (primary) hypertension: Secondary | ICD-10-CM

## 2020-02-20 ENCOUNTER — Other Ambulatory Visit: Payer: Self-pay | Admitting: *Deleted

## 2020-02-20 DIAGNOSIS — I10 Essential (primary) hypertension: Secondary | ICD-10-CM

## 2020-02-20 MED ORDER — METOPROLOL TARTRATE 100 MG PO TABS: 200.0000 mg | ORAL_TABLET | Freq: Two times a day (BID) | ORAL | 0 refills | Status: DC

## 2020-06-19 ENCOUNTER — Other Ambulatory Visit: Payer: Self-pay | Admitting: Family Medicine

## 2020-06-19 DIAGNOSIS — I10 Essential (primary) hypertension: Secondary | ICD-10-CM

## 2020-07-01 ENCOUNTER — Other Ambulatory Visit: Payer: Self-pay | Admitting: *Deleted

## 2020-07-01 DIAGNOSIS — I10 Essential (primary) hypertension: Secondary | ICD-10-CM

## 2020-07-01 MED ORDER — SPIRONOLACTONE 25 MG PO TABS: 25.0000 mg | ORAL_TABLET | Freq: Every day | ORAL | 3 refills | Status: DC

## 2020-07-01 MED ORDER — METOPROLOL TARTRATE 100 MG PO TABS: 200.0000 mg | ORAL_TABLET | Freq: Two times a day (BID) | ORAL | 1 refills | Status: DC

## 2020-07-01 NOTE — Addendum Note (Signed)
Addended by: Veronda Prude on: 07/01/2020 12:15 PM   Modules accepted: Orders

## 2020-07-01 NOTE — Telephone Encounter (Signed)
Pharmacy did not receive electronic script on 10/15 due to system wide downtime. Resent as prescribed by Dr. Neita Garnet.   Veronda Prude, RN

## 2020-08-08 ENCOUNTER — Other Ambulatory Visit: Payer: Self-pay | Admitting: Family Medicine

## 2020-08-08 DIAGNOSIS — I10 Essential (primary) hypertension: Secondary | ICD-10-CM

## 2020-08-18 ENCOUNTER — Other Ambulatory Visit: Payer: Self-pay | Admitting: Family Medicine

## 2020-08-18 DIAGNOSIS — I10 Essential (primary) hypertension: Secondary | ICD-10-CM

## 2020-08-18 DIAGNOSIS — E119 Type 2 diabetes mellitus without complications: Secondary | ICD-10-CM

## 2020-10-26 ENCOUNTER — Other Ambulatory Visit: Payer: Self-pay | Admitting: Family Medicine

## 2020-10-26 DIAGNOSIS — I10 Essential (primary) hypertension: Secondary | ICD-10-CM

## 2020-10-27 NOTE — Telephone Encounter (Signed)
Patient contacted via telephone to schedule f/u appt as he has not been seen since 2020. Patient scheduled for 3/17 8:50AM

## 2020-11-19 NOTE — Progress Notes (Signed)
SUBJECTIVE:   CHIEF COMPLAINT / HPI: HTN f/u   HTN  Jeffrey Stanton is a 65 y.o. male presenting for HTN follow up. He was last seen in 2020 with noted blood pressure to be elevated ranging from 150s-180s/90s consistently since 2018. Patient's current regimen includes amlodipine 10mg , spironolactone 25, lisinopril 40mg  and metoprolol 100mg  BID. He reports continued adherence to his medication regimen.  Patient is a non-smoker. Patient reports that he has not been following a diabetes lower heart friendly diet. Patient denies drinking alcohol since he was in his 37s. He denies any headache, blurry vision, chest pain or difficulty breathing.  He reports his blood pressures have been elevated since he was a child.  He denies having any imaging of his kidneys.  Tailbone pain Patient reports that he had a fall while getting out of the shower in September 2021.  He reports losing his balance and fell with his right buttocks hitting the toilet seat.  He reports that he did not have pain initially but within the week following his fall, he began to have pain at the inferior aspect of his right buttock.  He states that he has pain with prolonged sitting.  He has been taking Tylenol and states that he gets relief for about 30 minutes.  Patient attempted to go to urgent care center for follow-up but states that the Was long and that he had to stand for 2-1/2 hours which made his pain worse so he left without being evaluated.   DM  Patient last A1c in 2020 was 8.6. His diabetes medications include glimepiride. He reports having frequent blood sugars that are 200 and greater.  He reports he stopped checking the last 2 weeks due to having elevated blood sugars over time check.  He has not had his A1c checked recently.  Healthcare Maintenance We discussed ophthalmology exam.  Declines influenza vaccine due to previous reaction.  Patient reports that he has had his Covid vaccinations.  He has not had a  blister.  PERTINENT  PMH / PSH:  Resistant HTN  DM   OBJECTIVE:   BP (!) 187/96   Pulse 76   Ht 5\' 10"  (1.778 m)   Wt 228 lb 9.6 oz (103.7 kg)   SpO2 99%   BMI 32.80 kg/m   General: male appearing stated age in no acute distress Cardio: Normal S1 and S2, no S3 or S4. Rhythm is regular. No murmurs or rubs.  Bilateral radial pulses palpable Pulm: Clear to auscultation bilaterally, no crackles, wheezing, or diminished breath sounds. Normal respiratory effort, stable onRA Abdomen: Bowel sounds normal. Abdomen soft and non-tender. Extremities: No peripheral edema. Warm/ well perfused.  No gross deformity, scoliosis. TTP .  No midline or bony TTP. FROM. Strength LEs 5/5 all muscle groups.   2+ MSRs in patellar and achilles tendons, equal bilaterally. + SLRs. Sensation intact to light touch bilaterally. .  ASSESSMENT/PLAN:   Essential hypertension Blood pressure continues to be elevated at 187/96.  Patient asymptomatic without headache or blurry vision. We will add chlorthalidone 25 mg. CMP  T2DM (type 2 diabetes mellitus) (HCC)  Patient reports a diet that is not beneficial for diabetes management.  Frequent blood glucoses greater than 200, his stop measuring blood glucoses at home. Has been taking glimepiride We will check A1c today We will add Jardiance Discussed GLP-1 class of medication, patient declined this he does not want to inject himself  Fall as cause of accidental injury at home as  place of occurrence Patient reports fall while getting out of the shower and landed on [address] affecting his right hip and continues to have what he describes as "tailbone pain" We will send for coccyx and hip x-rays Have patient follow-up in the next 2-3 weeks      Ronnald Ramp, MD [facility]

## 2020-11-20 ENCOUNTER — Ambulatory Visit
Admission: RE | Admit: 2020-11-20 | Discharge: 2020-11-20 | Disposition: A | Discharge status: Home / Self Care | Payer: 59 | Source: Ambulatory Visit | Attending: Family Medicine | Admitting: Family Medicine

## 2020-11-20 ENCOUNTER — Ambulatory Visit (INDEPENDENT_AMBULATORY_CARE_PROVIDER_SITE_OTHER): Payer: 59 | Admitting: Family Medicine

## 2020-11-20 ENCOUNTER — Other Ambulatory Visit: Payer: Self-pay

## 2020-11-20 ENCOUNTER — Encounter: Payer: Self-pay | Admitting: Family Medicine

## 2020-11-20 VITALS — BP 187/96 | HR 76 | Ht 70.0 in | Wt 228.6 lb

## 2020-11-20 DIAGNOSIS — I10 Essential (primary) hypertension: Secondary | ICD-10-CM | POA: Diagnosis not present

## 2020-11-20 DIAGNOSIS — E119 Type 2 diabetes mellitus without complications: Secondary | ICD-10-CM

## 2020-11-20 DIAGNOSIS — W19XXXA Unspecified fall, initial encounter: Secondary | ICD-10-CM

## 2020-11-20 DIAGNOSIS — Y92009 Unspecified place in unspecified non-institutional (private) residence as the place of occurrence of the external cause: Secondary | ICD-10-CM | POA: Diagnosis not present

## 2020-11-20 LAB — POCT GLYCOSYLATED HEMOGLOBIN (HGB A1C): HbA1c, POC (controlled diabetic range): 9.5 % — AB (ref 0.0–7.0)

## 2020-11-20 MED ORDER — CHLORTHALIDONE 25 MG PO TABS: 25.0000 mg | ORAL_TABLET | Freq: Every day | ORAL | 3 refills | Status: DC

## 2020-11-20 MED ORDER — GABAPENTIN 100 MG PO CAPS: 100.0000 mg | ORAL_CAPSULE | Freq: Three times a day (TID) | ORAL | 3 refills | Status: DC

## 2020-11-20 MED ORDER — EMPAGLIFLOZIN 25 MG PO TABS: 25.0000 mg | ORAL_TABLET | Freq: Every day | ORAL | 3 refills | Status: DC

## 2020-11-20 NOTE — Assessment & Plan Note (Signed)
Patient reports fall while getting out of the shower and landed on [address] affecting his right hip and continues to have what he describes as "tailbone pain" We will send for coccyx and hip x-rays Have patient follow-up in the next 2-3 weeks

## 2020-11-20 NOTE — Assessment & Plan Note (Signed)
Patient reports a diet that is not beneficial for diabetes management.  Frequent blood glucoses greater than 200, his stop measuring blood glucoses at home. Has been taking glimepiride We will check A1c today We will add Jardiance Discussed GLP-1 class of medication, patient declined this he does not want to inject himself

## 2020-11-20 NOTE — Patient Instructions (Addendum)
It was a pleasure to see you today!  Thank you for choosing [facility] for your primary care.   Jeffrey Stanton was seen for hypertension and diabetes follow up.   Our plans for today were:  For your high blood pressure, I have prescribed a new medication called chlorthalidone.  Diabetes: I have prescribed a medication called  to help with lowering your hemoglobin A1c.   For your blood pressure, I have scheduled you with Dr. Nicholaus Bloom on 3/30 at 10:30 AM   I have sent in orders for you to have xrays of your back and hip. In the meantime, continue using the cushions to help with pain.   To keep you healthy, please keep in mind the following health maintenance items that you are due for:    1. Pnuemonia Vaccine  2. Foot Exam  3. Diabetes Eye Exam    You should return to our clinic in 4-6 weeks for follow up for your blood pressure and diabetes.   Best Wishes,   Dr. Neita Garnet    Tailbone Injury  The tailbone (coccyx) is the small bone at the lower end of the spine. A tailbone injury may involve stretched ligaments, bruising, or a broken bone (fracture). Tailbone injuries can be painful, and some may take a long time to heal. What are the causes? This condition may be caused by:  Falling and landing on the tailbone.  Repeated strain or friction from sitting for long periods of time. This may include actions such as rowing and bicycling.  Childbirth. In some cases, the cause may not be known. What are the signs or symptoms? Symptoms of this condition include:  Pain in the tailbone area or lower back, especially when sitting.  Pain or difficulty when standing up from a sitting position.  Bruising or swelling in the tailbone area.  Painful bowel movements.  In women, pain during intercourse. How is this diagnosed? This condition may be diagnosed based on:  Your symptoms.  A physical exam. If your health care provider suspects a fracture, you may have  additional tests, such as:  X-rays.  CT scan.  MRI. How is this treated? Most tailbone injuries heal on their own in 4-6 weeks. However, recovery time may be longer if the injury involves a fracture. Treatment for this condition may include:  NSAIDs or other over-the-counter medicines to help relieve your pain.  Using a large, rubber or inflated ring or cushion to take pressure off the tailbone when sitting.  Physical therapy.  Injecting the tailbone area with local anesthesia and steroid medicine. This is not normally needed unless the pain does not improve over time with over-the-counter pain medicines. Follow these instructions at home: Activity  Avoid sitting for long periods of time.  To prevent repeating an injury that is caused by strain or friction: ? Wear appropriate padding and sports gear when bicycling and rowing.  Increase your activity as the pain allows. Perform any exercises that are recommended by your health care provider or physical therapist. Managing pain, stiffness, and swelling  To help decrease discomfort when sitting: ? Sit on your rubber or inflated ring or cushion as told by your health care provider. ? Lean forward when you sit.  If directed, apply ice to the injured area: ? Put ice in a plastic bag. ? Place a towel between your skin and the bag. ? Leave the ice on for 20 minutes, 2-3 times per day for the first 1-2 days.  If directed, apply heat to the affected area as often as told by your health care provider. Use the heat source that your health care provider recommends, such as a moist heat pack or a heating pad. ? Place a towel between your skin and the heat source. ? Leave the heat on for 20-30 minutes. ? Remove the heat if your skin turns bright red. This is especially important if you are unable to feel pain, heat, or cold. You may have a greater risk of getting burned. General instructions  Take over-the-counter and prescription  medicines only as told by your health care provider.  To prevent or treat constipation or painful bowel movements, your health care provider may recommend that you: ? Drink enough fluid to keep your urine pale yellow. ? Eat foods that are high in fiber, such as fresh fruits and vegetables, whole grains, and beans. ? Limit foods that are high in fat and processed sugars, such as fried and sweet foods. ? Take an over-the-counter or prescription medicine for constipation.  Keep all follow-up visits as directed by your health care provider. This is important. Contact a health care provider if:  Your pain becomes worse or is not controlled with medicine.  Your bowel movements cause a great deal of discomfort.  You are unable to have a bowel movement after 4 days.  You have pain during intercourse. Summary  A tailbone injury may involve stretched ligaments, bruising, or a broken bone (fracture).  Tailbone injuries can be painful. Most heal on their own in 4-6 weeks.  Treatment may include taking NSAIDs, using a rubber or inflated ring or cushion when sitting, and physical therapy.  Follow any recommendations from your health care provider to prevent or treat constipation. This information is not intended to replace advice given to you by your health care provider. Make sure you discuss any questions you have with your health care provider. Document Revised: 09/20/2017 Document Reviewed: 09/20/2017 Elsevier Patient Education  2021 [employer].

## 2020-11-20 NOTE — Assessment & Plan Note (Signed)
Blood pressure continues to be elevated at 187/96.  Patient asymptomatic without headache or blurry vision. We will add chlorthalidone 25 mg. CMP

## 2020-11-21 LAB — COMPREHENSIVE METABOLIC PANEL
ALT: 52 IU/L — ABNORMAL HIGH (ref 0–44)
AST: 33 IU/L (ref 0–40)
Albumin/Globulin Ratio: 1.5 (ref 1.2–2.2)
Albumin: 4.6 g/dL (ref 3.8–4.8)
Alkaline Phosphatase: 78 IU/L (ref 44–121)
BUN/Creatinine Ratio: 18 (ref 10–24)
BUN: 19 mg/dL (ref 8–27)
Bilirubin Total: 0.3 mg/dL (ref 0.0–1.2)
CO2: 21 mmol/L (ref 20–29)
Calcium: 9.9 mg/dL (ref 8.6–10.2)
Chloride: 97 mmol/L (ref 96–106)
Creatinine, Ser: 1.05 mg/dL (ref 0.76–1.27)
Globulin, Total: 3.1 g/dL (ref 1.5–4.5)
Glucose: 309 mg/dL — ABNORMAL HIGH (ref 65–99)
Potassium: 4.7 mmol/L (ref 3.5–5.2)
Sodium: 135 mmol/L (ref 134–144)
Total Protein: 7.7 g/dL (ref 6.0–8.5)
eGFR: 80 mL/min/{1.73_m2} (ref >59)

## 2020-11-21 LAB — LIPID PANEL
Chol/HDL Ratio: 6.6 ratio — ABNORMAL HIGH (ref 0.0–5.0)
Cholesterol, Total: 210 mg/dL — ABNORMAL HIGH (ref 100–199)
HDL: 32 mg/dL — ABNORMAL LOW (ref >39)
LDL Chol Calc (NIH): 108 mg/dL — ABNORMAL HIGH (ref 0–99)
Triglycerides: 406 mg/dL — ABNORMAL HIGH (ref 0–149)
VLDL Cholesterol Cal: 70 mg/dL — ABNORMAL HIGH (ref 5–40)

## 2020-11-24 ENCOUNTER — Telehealth: Payer: Self-pay

## 2020-11-24 NOTE — Telephone Encounter (Signed)
Patient calls nurse line regarding concerns with new rx of gabapentin. Patient reports increased drowsiness when on medication. States that medication will cause him to sleep for 18 hours in the day (intermittently). Patient states that he would get up to do something but as soon as he sits down, he falls asleep.  Patient would like alternative due to increased sleepiness.   Patient also reports that he cannot take jardiance due to previous episodes with severe GI upset.   Please advise.   Veronda Prude, RN

## 2020-11-25 MED ORDER — GLIPIZIDE ER 5 MG PO TB24: 5.0000 mg | ORAL_TABLET | Freq: Every day | ORAL | 3 refills | Status: DC

## 2020-11-25 MED ORDER — DULOXETINE HCL 20 MG PO CPEP: 20.0000 mg | ORAL_CAPSULE | Freq: Every day | ORAL | 2 refills | Status: DC

## 2020-11-25 NOTE — Telephone Encounter (Signed)
Contacted patient to discuss effects of gabapentin, report xray results and update on jardiance.   Patient reports he was taking gabapentin 100mg  at bedtime and was excessively drowsy. He would like to discontinue this medication as he is not sure it helped his tailbone pain. Offered a trial of cymbalta as option. Explained this as method for other patients with lower back pain. Patient agreeable to try this.  Reviewed results of hip and coccyx xrays, no fractures nor arthropathy changes. Patient voiced understanding.   Patient reports that a few years ago, he tried jardiance and experienced emesis and diarrhea so he stopped it and symptoms resolved. He reports he did not pick the medication up from the pharmacy because of this past experience. Discussed switching glimepiride to glipizide, patient agreeable to this.   Summary Discontinue gabapentin and glimepiride  Start duloxetine 20mg   Start glipizide

## 2020-12-03 ENCOUNTER — Ambulatory Visit: Payer: Self-pay | Admitting: Pharmacist

## 2020-12-11 NOTE — Progress Notes (Deleted)
    SUBJECTIVE:   CHIEF COMPLAINT / HPI:   DM  Patient with elevated Hgb a1c of 9.0. Recommended to start jardiance however patient reports intolerance due to GI upset. Patient wants to avoid any type of injections. Current regimen includes glipizide.    PERTINENT  PMH / PSH: ***  OBJECTIVE:   There were no vitals taken for this visit.  ***  ASSESSMENT/PLAN:   No problem-specific Assessment & Plan notes found for this encounter.     Ronnald Ramp, MD [facility]   {    This will disappear when note is signed, click to select method of visit    :1}

## 2020-12-22 ENCOUNTER — Ambulatory Visit: Payer: 59 | Admitting: Family Medicine

## 2021-02-22 ENCOUNTER — Other Ambulatory Visit: Payer: Self-pay | Admitting: Family Medicine

## 2021-02-22 DIAGNOSIS — I10 Essential (primary) hypertension: Secondary | ICD-10-CM

## 2021-03-12 ENCOUNTER — Other Ambulatory Visit: Payer: Self-pay | Admitting: Family Medicine

## 2021-03-12 DIAGNOSIS — I10 Essential (primary) hypertension: Secondary | ICD-10-CM

## 2021-05-06 NOTE — Progress Notes (Signed)
    SUBJECTIVE:   CHIEF COMPLAINT / HPI: HTN f/u   HTN Hypertension: - Medications:     Amlodipine 10    Lisinopril 40    Lopressor 100 twice daily    Spironolactone 25    Chlorthalidone 25  - Compliance: not taking chlorthalidone  - Checking BP at home: no - Denies any SOB, vision changes, LE edema, medication SEs, or symptoms of hypotension - Diet: working on limiting portion sizes, eats a lot of frozen meals  - Exercise: no  - reports urgency but has small amount of urine   Diabetes Current Regimen: glimperide  CBGs: does not check blood glucose at home  Last A1c:  Lab Results  Component Value Date   HGBA1C 9.7 (A) 05/07/2021   Reports polyuria, polydipsia, reports some neuropathy symptoms, denies hypoglycemia  Last Eye Exam: has been several years since eye exam  Statin: not currently on statin  ACE/ARB: lisinopril    HM  PNA vaccine Diabetes eye exam Diabetes foot exam COVID vaccine  PERTINENT  PMH / PSH:  Resistant HTN  DM    OBJECTIVE:   BP (!) 156/106   Pulse 100   Wt 228 lb 6.4 oz (103.6 kg)   SpO2 100%   BMI 32.77 kg/m   General: male appearing stated age in no acute distress Cardio: Normal S1 and S2, no S3 or S4. Rhythm is regular. No murmurs or rubs.  Bilateral radial pulses palpable Pulm: Clear to auscultation bilaterally, no crackles, wheezing, or diminished breath sounds. Normal respiratory effort, stable on RA Abdomen: Bowel sounds normal. Abdomen soft and non-tender.  Extremities: No peripheral edema. Warm/ well perfused.  Neuro: pt alert and oriented x4, PERRLA, EOMI, 5/5 strength in all extremities    ASSESSMENT/PLAN:   Resistant hypertension Uncontrolled on 3+ agents. Adherence to regimen is reported although patient did not start chlorthalidone when prescribed previously.  Advised to start chlorthalidone in addition to previous agents  Recommend checking blood pressures at home  Patient advised to incorporate walking and low  sodium diet to help with BP measurements, long discussion today regarding risks of heart disease and stroke with longstanding uncontrolled BP.   T2DM (type 2 diabetes mellitus) (HCC) A1c still above goal of 7 at 9.2 today. Patient reports not measuring blood glucose levels at home. Has not been able to tolerate metformin due to diarrhea nor SGLT2 inhibitor class. Patient reports adherence to glipizide. 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 Very lengthy discussion about importance of glucose control to prevent cerebrovascular and cardiovascular events.  - offered pharmacy visit to discuss medication options, patient declines stating that he has had this before  - patient is agreeable to trial of trulicity, however expresses concerns for insurance coverage, shared decision making to prescribe and see what costs may be - pt to follow up in four weeks      Ronnald Ramp, MD [facility]

## 2021-05-07 ENCOUNTER — Ambulatory Visit (INDEPENDENT_AMBULATORY_CARE_PROVIDER_SITE_OTHER): Payer: 59 | Admitting: Family Medicine

## 2021-05-07 ENCOUNTER — Other Ambulatory Visit: Payer: Self-pay

## 2021-05-07 ENCOUNTER — Encounter: Payer: Self-pay | Admitting: Family Medicine

## 2021-05-07 VITALS — BP 156/106 | HR 100 | Wt 228.4 lb

## 2021-05-07 DIAGNOSIS — I10 Essential (primary) hypertension: Secondary | ICD-10-CM

## 2021-05-07 DIAGNOSIS — E119 Type 2 diabetes mellitus without complications: Secondary | ICD-10-CM

## 2021-05-07 LAB — POCT GLYCOSYLATED HEMOGLOBIN (HGB A1C): HbA1c, POC (controlled diabetic range): 9.7 % — AB (ref 0.0–7.0)

## 2021-05-07 MED ORDER — TRULICITY 0.75 MG/0.5ML ~~LOC~~ SOAJ: 0.7500 mg | SUBCUTANEOUS | 3 refills | Status: DC

## 2021-05-07 MED ORDER — CHLORTHALIDONE 25 MG PO TABS: 25.0000 mg | ORAL_TABLET | Freq: Every day | ORAL | 3 refills | Status: DC

## 2021-05-07 NOTE — Patient Instructions (Addendum)
You will need a diabetes eye exam.   I have prescribed Trulicity to help with your diabetes. I recommend checking your blood sugar twice daily. Once before any meal when you first wake up and 2 hours after your largest meal of the day.   For you blood pressure, please add the chlorthalidone and follow up with me as scheduled.   Please review the information for the medication that we discussed.   Dulaglutide Injection What is this medication? DULAGLUTIDE (DOO la GLOO tide) treats type 2 diabetes. It works by increasing insulin levels in your body, which decreases your blood sugar (glucose). It also reduces the amount of sugar released into your blood and slows down your digestion. It can also be used to lower the risk of heart attack and stroke in people with type 2 diabetes. Changes to diet and exercise are often combined with this medication. This medicine may be used for other purposes; ask your health care provider or pharmacist if you have questions. COMMON BRAND NAME(S): Trulicity What should I tell my care team before I take this medication? They need to know if you have any of these conditions: Endocrine tumors (MEN 2) or if someone in your family had these tumors Eye disease, vision problems History of pancreatitis Kidney disease Liver disease Stomach or intestine problems Thyroid cancer or if someone in your family had thyroid cancer An unusual or allergic reaction to dulaglutide, other medications, foods, dyes, or preservatives Pregnant or trying to get pregnant Breast-feeding How should I use this medication? This medication is injected under the skin. You will be taught how to prepare and give it. Take it as directed on the prescription label on the same day of each week. Do NOT prime the pen. Keep taking it unless your care team tells you to stop. If you use this medication with insulin, you should inject this medication and the insulin separately. Do not mix them together.  Do not give the injections right next to each other. Change (rotate) injection sites with each injection. This medication comes with INSTRUCTIONS FOR USE. Ask your pharmacist for directions on how to use this medication. Read the information carefully. Talk to your pharmacist or care team if you have questions. It is important that you put your used needles and syringes in a special sharps container. Do not put them in a trash can. If you do not have a sharps container, call your pharmacist or care team to get one. A special MedGuide will be given to you by the pharmacist with each prescription and refill. Be sure to read this information carefully each time. Talk to your care team about the use of this medication in children. Special care may be needed. Overdosage: If you think you have taken too much of this medicine contact a poison control center or emergency room at once. NOTE: This medicine is only for you. Do not share this medicine with others. What if I miss a dose? If you miss a dose, take it as soon as you can unless it is more than 3 days late. If it is more than 3 days late, skip the missed dose. Take the next dose at the normal time. What may interact with this medication? Other medications for diabetes Many medications may cause changes in blood sugar, these include: Alcohol containing beverages Antiviral medications for HIV or AIDS Aspirin and aspirin-like medications Certain medications for blood pressure, heart disease, irregular heart beat Chromium Diuretics Male hormones, such as estrogens  or progestins, birth control pills Fenofibrate Gemfibrozil Isoniazid Lanreotide Male hormones or anabolic steroids MAOIs like Carbex, Eldepryl, Marplan, Nardil, and Parnate Medications for allergies, asthma, cold, or cough Medications for depression, anxiety, or psychotic disturbances Medications for weight loss Niacin Nicotine NSAIDs, medications for pain and inflammation, like  ibuprofen or naproxen Octreotide Pasireotide Pentamidine Phenytoin Probenecid Quinolone antibiotics such as ciprofloxacin, levofloxacin, ofloxacin Some herbal dietary supplements Steroid medications such as prednisone or cortisone Sulfamethoxazole; trimethoprim Thyroid hormones Some medications can hide the warning symptoms of low blood sugar (hypoglycemia). You may need to monitor your blood sugar more closely if you are taking one of these medications. These include: Beta-blockers, often used for high blood pressure or heart problems (examples include atenolol, metoprolol, propranolol) Clonidine Guanethidine Reserpine This list may not describe all possible interactions. Give your health care provider a list of all the medicines, herbs, non-prescription drugs, or dietary supplements you use. Also tell them if you smoke, drink alcohol, or use illegal drugs. Some items may interact with your medicine. What should I watch for while using this medication? Visit your care team for regular checks on your progress. Check with your care team if you have severe diarrhea, nausea, and vomiting, or if you sweat a lot. The loss of too much body fluid may make it dangerous for you to take this medication. A test called the HbA1C (A1C) will be monitored. This is a simple blood test. It measures your blood sugar control over the last 2 to 3 months. You will receive this test every 3 to 6 months. Learn how to check your blood sugar. Learn the symptoms of low and high blood sugar and how to manage them. Always carry a quick-source of sugar with you in case you have symptoms of low blood sugar. Examples include hard sugar candy or glucose tablets. Make sure others know that you can choke if you eat or drink when you develop serious symptoms of low blood sugar, such as seizures or unconsciousness. Get medical help at once. Tell your care team if you have high blood sugar. You might need to change the dose of  your medication. If you are sick or exercising more than usual, you may need to change the dose of your medication. Do not skip meals. Ask your care team if you should avoid alcohol. Many nonprescription cough and cold products contain sugar or alcohol. These can affect blood sugar. Pens should never be shared. Even if the needle is changed, sharing may result in passing of viruses like hepatitis or HIV. Wear a medical ID bracelet or chain. Carry a card that describes your condition. List the medications and doses you take on the card. What side effects may I notice from receiving this medication? Side effects that you should report to your care team as soon as possible: Allergic reactions-skin rash, itching, hives, swelling of the face, lips, tongue, or throat Change in vision Dehydration-increased thirst, dry mouth, feeling faint or lightheaded, headache, dark yellow or brown urine Kidney injury-decrease in the amount of urine, swelling of the ankles, hands, or feet Pancreatitis-severe stomach pain that spreads to your back or gets worse after eating or when touched, fever, nausea, vomiting Thyroid cancer-new mass or lump in the neck, pain or trouble swallowing, trouble breathing, hoarseness Side effects that usually do not require medical attention (report to your care team if they continue or are bothersome): Diarrhea Loss of appetite Nausea Stomach pain Vomiting This list may not describe all possible side effects.  Call your doctor for medical advice about side effects. You may report side effects to FDA at 1-800-FDA-1088. Where should I keep my medication? Keep out of the reach of children and pets. Refrigeration (preferred): Store unopened pens in a refrigerator between 2 and 8 degrees C (36 and 46 degrees F). Keep it in the original carton until you are ready to take it. Do not freeze or use if the medication has been frozen. Protect from light. Get rid of any unused medication after the  expiration date on the label. Room Temperature: The pen may be stored at room temperature below 30 degrees C (86 degrees F) for up to a total of 14 days if needed. Protect from light. Avoid exposure to extreme heat. If it is stored at room temperature, throw away any unused medication after 14 days or after it expires, whichever is first. To get rid of medications that are no longer needed or have expired: Take the medication to a medication take-back program. Check with your pharmacy or law enforcement to find a location. If you cannot return the medication, ask your pharmacist or care team how to get rid of this medication safely. NOTE: This sheet is a summary. It may not cover all possible information. If you have questions about this medicine, talk to your doctor, pharmacist, or health care provider.  2022 Elsevier/Gold Standard (2020-10-20 14:13:14)

## 2021-05-09 ENCOUNTER — Other Ambulatory Visit: Payer: Self-pay | Admitting: Family Medicine

## 2021-05-09 NOTE — Assessment & Plan Note (Signed)
Uncontrolled on 3+ agents. Adherence to regimen is reported although patient did not start chlorthalidone when prescribed previously.  Advised to start chlorthalidone in addition to previous agents  Recommend checking blood pressures at home  Patient advised to incorporate walking and low sodium diet to help with BP measurements, long discussion today regarding risks of heart disease and stroke with longstanding uncontrolled BP.

## 2021-05-09 NOTE — Assessment & Plan Note (Addendum)
A1c still above goal of 7 at 9.2 today. Patient reports not measuring blood glucose levels at home. Has not been able to tolerate metformin due to diarrhea nor SGLT2 inhibitor class. Patient reports adherence to glipizide. 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 Very lengthy discussion about importance of glucose control to prevent cerebrovascular and cardiovascular events.  - offered pharmacy visit to discuss medication options, patient declines stating that he has had this before  - patient is agreeable to trial of trulicity, however expresses concerns for insurance coverage, shared decision making to prescribe and see what costs may be - pt to follow up in four weeks

## 2021-05-13 ENCOUNTER — Telehealth: Payer: Self-pay

## 2021-05-13 NOTE — Telephone Encounter (Signed)
Contacted patient via telephone.  Confirmed that patient should be taking Glucotrol and no longer taking glimepiride.  Patient voiced understanding.  Checked in to see if patient was able to pick up Trulicity prescription to which he states he has not been able to pick this up.  Patient reports some reservations about starting the new medication.  Reviewed risks of continued elevated blood sugar in strong recommendation for patient to give Trulicity a trial as long as it is affordable.  Asked for patient to notify our office if this medication would be too costly.  Patient agreeable to this plan.  In summary, patient to continue on Glucotrol 5 mg daily along with Trulicity and follow-up scheduled for 9/29  Ronnald Ramp, MD [facility], PGY-3 [phone]

## 2021-05-13 NOTE — Telephone Encounter (Signed)
Patient calls nurse line regarding glimepiride being denied. Patient reports that he has been taking both glimepiride and glipizide. There appears to be confusion on medication management of diabetes. Please advise.   Veronda Prude, RN

## 2021-05-27 ENCOUNTER — Other Ambulatory Visit: Payer: Self-pay | Admitting: Family Medicine

## 2021-05-27 DIAGNOSIS — I10 Essential (primary) hypertension: Secondary | ICD-10-CM

## 2021-06-04 ENCOUNTER — Ambulatory Visit: Payer: 59 | Admitting: Family Medicine

## 2021-06-14 ENCOUNTER — Other Ambulatory Visit: Payer: Self-pay | Admitting: Family Medicine

## 2021-06-14 DIAGNOSIS — I10 Essential (primary) hypertension: Secondary | ICD-10-CM

## 2021-08-13 ENCOUNTER — Other Ambulatory Visit: Payer: Self-pay | Admitting: Family Medicine

## 2021-08-13 DIAGNOSIS — I10 Essential (primary) hypertension: Secondary | ICD-10-CM

## 2021-10-02 ENCOUNTER — Other Ambulatory Visit: Payer: Self-pay | Admitting: Family Medicine

## 2021-10-02 DIAGNOSIS — I10 Essential (primary) hypertension: Secondary | ICD-10-CM

## 2021-10-14 ENCOUNTER — Other Ambulatory Visit: Payer: Self-pay | Admitting: Family Medicine

## 2021-10-14 DIAGNOSIS — I10 Essential (primary) hypertension: Secondary | ICD-10-CM

## 2021-10-17 ENCOUNTER — Other Ambulatory Visit: Payer: Self-pay | Admitting: Family Medicine

## 2021-10-17 DIAGNOSIS — I10 Essential (primary) hypertension: Secondary | ICD-10-CM

## 2021-12-01 ENCOUNTER — Other Ambulatory Visit: Payer: Self-pay | Admitting: Family Medicine

## 2022-01-13 ENCOUNTER — Other Ambulatory Visit: Payer: Self-pay

## 2022-01-13 DIAGNOSIS — I10 Essential (primary) hypertension: Secondary | ICD-10-CM

## 2022-01-13 MED ORDER — METOPROLOL TARTRATE 100 MG PO TABS: 200.0000 mg | ORAL_TABLET | Freq: Two times a day (BID) | ORAL | 0 refills | Status: DC

## 2022-01-25 ENCOUNTER — Other Ambulatory Visit: Payer: Self-pay

## 2022-01-25 DIAGNOSIS — I10 Essential (primary) hypertension: Secondary | ICD-10-CM

## 2022-01-25 MED ORDER — AMLODIPINE BESYLATE 10 MG PO TABS: 10.0000 mg | ORAL_TABLET | Freq: Every day | ORAL | 0 refills | Status: DC

## 2022-01-25 MED ORDER — LISINOPRIL 40 MG PO TABS: 40.0000 mg | ORAL_TABLET | Freq: Every day | ORAL | 0 refills | Status: DC

## 2022-02-25 ENCOUNTER — Other Ambulatory Visit: Payer: Self-pay | Admitting: Family Medicine

## 2022-03-11 ENCOUNTER — Other Ambulatory Visit: Payer: Self-pay | Admitting: Family Medicine

## 2022-03-11 DIAGNOSIS — I10 Essential (primary) hypertension: Secondary | ICD-10-CM

## 2022-04-19 ENCOUNTER — Other Ambulatory Visit: Payer: Self-pay | Admitting: *Deleted

## 2022-04-19 DIAGNOSIS — I10 Essential (primary) hypertension: Secondary | ICD-10-CM

## 2022-04-19 MED ORDER — METOPROLOL TARTRATE 100 MG PO TABS: ORAL_TABLET | ORAL | 0 refills | Status: DC

## 2022-04-26 ENCOUNTER — Other Ambulatory Visit: Payer: Self-pay | Admitting: Family Medicine

## 2022-04-26 DIAGNOSIS — I10 Essential (primary) hypertension: Secondary | ICD-10-CM

## 2022-05-11 ENCOUNTER — Other Ambulatory Visit: Payer: Self-pay | Admitting: Family Medicine

## 2022-05-18 NOTE — Progress Notes (Signed)
    SUBJECTIVE:   CHIEF COMPLAINT / HPI:   T2DM Due for foot exam, and ophthalmology exam. He would like a referral to an ophthalmologist.  Pt currently takes  glipizide 5 mg daily  Last Hgb A1c 9.7 1 year ago. Hgb A1c today of 9.4  HTN Pt currently takes lisinopril 40 mg daily, lopressor 100 mg daily, spironolactone 25 mg daily, amlodipine 10 mg daily. No longer taking chlorthalidone.  HLD Last lipid panel on 11/20/2020. Pt is not currently on a statin. Used to take Simvastatin but did not tolerate. He is willing to try Crestor today.   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  Would like to wait on Zoster vaccine until he gets medicaid.   Body pains/other complaints Did not fully discuss all of patient's complaints however, patient came in with long written list of complaints including blurry vision, bone pain, leg pain, ingrown toenails, abdominal pain, etc. Pt takes 1500 mg tylenol in the morning and 1500 mg in the evening.   PERTINENT  PMH / PSH: T2DM, HTN, HLD  OBJECTIVE:   Vitals:   05/19/22 0832 05/19/22 0926  BP: (!) 177/96 (!) 148/87  Pulse: 80   SpO2: 100%      General: NAD, pleasant, able to participate in exam Cardiac: RRR, no murmurs. Respiratory: CTAB, normal effort, No wheezes, rales or rhonchi  Abdomen: Bowel sounds present, soft, nondistended, mild diffuse tenderness to palpation without guarding or rebound tenderness Skin: warm and dry, no rashes noted Neuro: alert, no obvious focal deficits Psych: Normal affect and mood  ASSESSMENT/PLAN:   T2DM (type 2 diabetes mellitus) (HCC) Hemoglobin A1c high at 9.4.  Glipizide was switched from extended release to short acting 10 mg daily, Ozempic 3 mg daily was added to regimen as patient does not want injectables, along with Farxiga 5 mg daily.  We discussed for patient to look out for  dehydration, dysuria, symptoms of low blood sugar.  Patient advised to check his blood sugar if he is not feeling well and to call the office if it gets below 100.  We will plan to recheck A1c in 3 months.  Patient referred to ophthalmology for diabetic eye exam, can complete diabetic foot exam at next visit on 9/19  Resistant hypertension Patient to continue lisinopril 40 mg daily, Lopressor 100 mg daily, spironolactone 25 mg daily, amlodipine 10 mg daily and chlorthalidone 25 mg daily was added back to regimen.  We will recheck blood pressure next week along with BMP.   Hyperlipidemia Lipid panel and CMP today, will start patient on Crestor 20 mg daily  Generalized abdominal pain Abdominal pain not fully discussed at this visit however since patient complains of abdominal pain in addition to several other complaints such as bone pain and fatigue, will check CBC to rule out anemia and possibility of GI bleed as patient has never had a colonoscopy.    Due to long list of complaints, patient is scheduled in clinic with me next week on 9/19 he was advised to take Tylenol 1 g at a time every 6 hours as needed for pains along with Voltaren gel up to 4 times a day.  Dr. Erick Alley, DO [facility]

## 2022-05-19 ENCOUNTER — Ambulatory Visit (INDEPENDENT_AMBULATORY_CARE_PROVIDER_SITE_OTHER): Payer: Commercial Managed Care - HMO | Admitting: Student

## 2022-05-19 ENCOUNTER — Encounter: Payer: Self-pay | Admitting: Student

## 2022-05-19 VITALS — BP 148/87 | HR 80 | Ht 70.0 in | Wt 222.2 lb

## 2022-05-19 DIAGNOSIS — E119 Type 2 diabetes mellitus without complications: Secondary | ICD-10-CM | POA: Diagnosis not present

## 2022-05-19 DIAGNOSIS — I10 Essential (primary) hypertension: Secondary | ICD-10-CM

## 2022-05-19 DIAGNOSIS — E785 Hyperlipidemia, unspecified: Secondary | ICD-10-CM | POA: Insufficient documentation

## 2022-05-19 DIAGNOSIS — R1084 Generalized abdominal pain: Secondary | ICD-10-CM | POA: Insufficient documentation

## 2022-05-19 LAB — POCT GLYCOSYLATED HEMOGLOBIN (HGB A1C): HbA1c, POC (controlled diabetic range): 9.4 % — AB (ref 0.0–7.0)

## 2022-05-19 MED ORDER — DAPAGLIFLOZIN PROPANEDIOL 5 MG PO TABS: 5.0000 mg | ORAL_TABLET | Freq: Every day | ORAL | 3 refills | Status: DC

## 2022-05-19 MED ORDER — GLIPIZIDE 10 MG PO TABS: 10.0000 mg | ORAL_TABLET | Freq: Every day | ORAL | 3 refills | Status: DC

## 2022-05-19 MED ORDER — ROSUVASTATIN CALCIUM 20 MG PO TABS: 20.0000 mg | ORAL_TABLET | Freq: Every day | ORAL | 3 refills | Status: DC

## 2022-05-19 MED ORDER — CHLORTHALIDONE 25 MG PO TABS: 25.0000 mg | ORAL_TABLET | Freq: Every day | ORAL | 3 refills | Status: DC

## 2022-05-19 MED ORDER — SEMAGLUTIDE 3 MG PO TABS: 3.0000 mg | ORAL_TABLET | Freq: Every day | ORAL | 3 refills | Status: DC

## 2022-05-19 NOTE — Patient Instructions (Signed)
It was great to see you! Thank you for allowing me to participate in your care!  I recommend that you always bring your medications to each appointment as this makes it easy to ensure you are on the correct medications and helps [place] not miss when refills are needed.  Our plans for today:  -I am starting you on several new medications today the following or for diabetes: Farxiga 5 mg daily, Ozempic 3 mg daily and I am increasing your glipizide to 10 mg daily.  If you begin to feel unwell, I recommend checking your sugars.  If your blood sugar is below 100 please call the office.  If you are feeling dehydrated, having burning when you urinate, please let me know. -I have sent in a prescription for a cholesterol-lowering medication called Crestor, please take this daily -I am restarting you on chlorthalidone, medication for blood pressure -Please return in 2 weeks for blood pressure check and labs  We are checking some labs today, I will call you if they are abnormal will send you a MyChart message or a letter if they are normal.  If you do not hear about your labs in the next 2 weeks please let us know.  Take care and seek immediate care sooner if you develop any concerns.   Dr. Erick Alley, DO [facility]

## 2022-05-19 NOTE — Assessment & Plan Note (Addendum)
Patient to continue lisinopril 40 mg daily, Lopressor 100 mg daily, spironolactone 25 mg daily, amlodipine 10 mg daily and chlorthalidone 25 mg daily was added back to regimen.  We will recheck blood pressure next week along with BMP.

## 2022-05-19 NOTE — Assessment & Plan Note (Addendum)
Abdominal pain not fully discussed at this visit however since patient complains of generalized abdominal pain in addition to several other complaints such as bone pain and fatigue, will check CBC to rule out anemia and possibility of GI bleed as patient has never had a colonoscopy.  I have no concern for acute abdomen at this time as vitals are stable and abdominal exam overall benign.

## 2022-05-19 NOTE — Assessment & Plan Note (Signed)
Lipid panel and CMP today, will start patient on Crestor 20 mg daily

## 2022-05-19 NOTE — Assessment & Plan Note (Signed)
Hemoglobin A1c high at 9.4.  Glipizide was switched from extended release to short acting 10 mg daily, Ozempic 3 mg daily was added to regimen as patient does not want injectables, along with Farxiga 5 mg daily.  We discussed for patient to look out for dehydration, dysuria, symptoms of low blood sugar.  Patient advised to check his blood sugar if he is not feeling well and to call the office if it gets below 100.  We will plan to recheck A1c in 3 months.  Patient referred to ophthalmology for diabetic eye exam, can complete diabetic foot exam at next visit on 9/19

## 2022-05-20 ENCOUNTER — Telehealth: Payer: Self-pay | Admitting: Student

## 2022-05-20 LAB — CBC WITH DIFFERENTIAL/PLATELET
Basophils Absolute: 0.1 10*3/uL (ref 0.0–0.2)
Basos: 1 %
EOS (ABSOLUTE): 0.3 10*3/uL (ref 0.0–0.4)
Eos: 4 %
Hematocrit: 40.9 % (ref 37.5–51.0)
Hemoglobin: 13.7 g/dL (ref 13.0–17.7)
Immature Grans (Abs): 0 10*3/uL (ref 0.0–0.1)
Immature Granulocytes: 0 %
Lymphocytes Absolute: 2.2 10*3/uL (ref 0.7–3.1)
Lymphs: 31 %
MCH: 28.4 pg (ref 26.6–33.0)
MCHC: 33.5 g/dL (ref 31.5–35.7)
MCV: 85 fL (ref 79–97)
Monocytes Absolute: 0.6 10*3/uL (ref 0.1–0.9)
Monocytes: 8 %
Neutrophils Absolute: 4 10*3/uL (ref 1.4–7.0)
Neutrophils: 56 %
Platelets: 172 10*3/uL (ref 150–450)
RBC: 4.82 x10E6/uL (ref 4.14–5.80)
RDW: 13.3 % (ref 11.6–15.4)
WBC: 7.1 10*3/uL (ref 3.4–10.8)

## 2022-05-20 LAB — LIPID PANEL
Chol/HDL Ratio: 6.9 ratio — ABNORMAL HIGH (ref 0.0–5.0)
Cholesterol, Total: 206 mg/dL — ABNORMAL HIGH (ref 100–199)
HDL: 30 mg/dL — ABNORMAL LOW (ref >39)
LDL Chol Calc (NIH): 91 mg/dL (ref 0–99)
Triglycerides: 511 mg/dL — ABNORMAL HIGH (ref 0–149)
VLDL Cholesterol Cal: 85 mg/dL — ABNORMAL HIGH (ref 5–40)

## 2022-05-20 LAB — COMPREHENSIVE METABOLIC PANEL
ALT: 38 IU/L (ref 0–44)
AST: 31 IU/L (ref 0–40)
Albumin/Globulin Ratio: 1.5 (ref 1.2–2.2)
Albumin: 4.6 g/dL (ref 3.9–4.9)
Alkaline Phosphatase: 56 IU/L (ref 44–121)
BUN/Creatinine Ratio: 22 (ref 10–24)
BUN: 29 mg/dL — ABNORMAL HIGH (ref 8–27)
Bilirubin Total: 0.4 mg/dL (ref 0.0–1.2)
CO2: 22 mmol/L (ref 20–29)
Calcium: 10.2 mg/dL (ref 8.6–10.2)
Chloride: 101 mmol/L (ref 96–106)
Creatinine, Ser: 1.29 mg/dL — ABNORMAL HIGH (ref 0.76–1.27)
Globulin, Total: 3 g/dL (ref 1.5–4.5)
Glucose: 231 mg/dL — ABNORMAL HIGH (ref 70–99)
Potassium: 4.5 mmol/L (ref 3.5–5.2)
Sodium: 140 mmol/L (ref 134–144)
Total Protein: 7.6 g/dL (ref 6.0–8.5)
eGFR: 62 mL/min/{1.73_m2} (ref >59)

## 2022-05-20 NOTE — Telephone Encounter (Signed)
Called to discuss recent labs. Lipid panel shows elevated cholesterol and pt was started on statin yesterday, will check lipid panel next year. CMP showed elevated creatinine to 1.29. We will monitor cr and check BMP at next visit on 9//19.

## 2022-05-25 ENCOUNTER — Other Ambulatory Visit: Payer: Self-pay | Admitting: Student

## 2022-05-25 DIAGNOSIS — I10 Essential (primary) hypertension: Secondary | ICD-10-CM

## 2022-05-27 NOTE — Telephone Encounter (Signed)
Patient returns call to nurse line.   Patient reports he is taking (2)100mg  tablets BID of Metoprolol.   Will forward back to provider.

## 2022-05-27 NOTE — Telephone Encounter (Signed)
Attempted to reach patient regarding metoprolol dosage.  Documentation discrepancy and Dr. Ronnald Ramp notes versus refill request.  Called patient to clarify metoprolol dosage/strength and frequency of medication.  Left message asking him to call back with information.

## 2022-06-07 ENCOUNTER — Ambulatory Visit: Payer: Commercial Managed Care - HMO | Admitting: Family Medicine

## 2022-08-16 ENCOUNTER — Other Ambulatory Visit: Payer: Self-pay | Admitting: *Deleted

## 2022-08-16 DIAGNOSIS — I10 Essential (primary) hypertension: Secondary | ICD-10-CM

## 2022-08-16 MED ORDER — LISINOPRIL 40 MG PO TABS: 40.0000 mg | ORAL_TABLET | Freq: Every day | ORAL | 0 refills | Status: DC

## 2022-09-20 ENCOUNTER — Other Ambulatory Visit: Payer: Self-pay

## 2022-09-20 DIAGNOSIS — I10 Essential (primary) hypertension: Secondary | ICD-10-CM

## 2022-09-20 MED ORDER — METOPROLOL TARTRATE 100 MG PO TABS: ORAL_TABLET | ORAL | 2 refills | Status: DC

## 2022-09-27 ENCOUNTER — Other Ambulatory Visit: Payer: Self-pay | Admitting: Family Medicine

## 2022-09-27 DIAGNOSIS — I10 Essential (primary) hypertension: Secondary | ICD-10-CM

## 2022-10-13 IMAGING — CR DG SACRUM/COCCYX 2+V
3 series · 3 of 3 positions shown · non-contrast
Comparison: None.

CLINICAL DATA: Chronic coccyx pain since fall last [REDACTED].

EXAM:
SACRUM AND COCCYX - 2+ VIEW

[t sacrum a.p.]
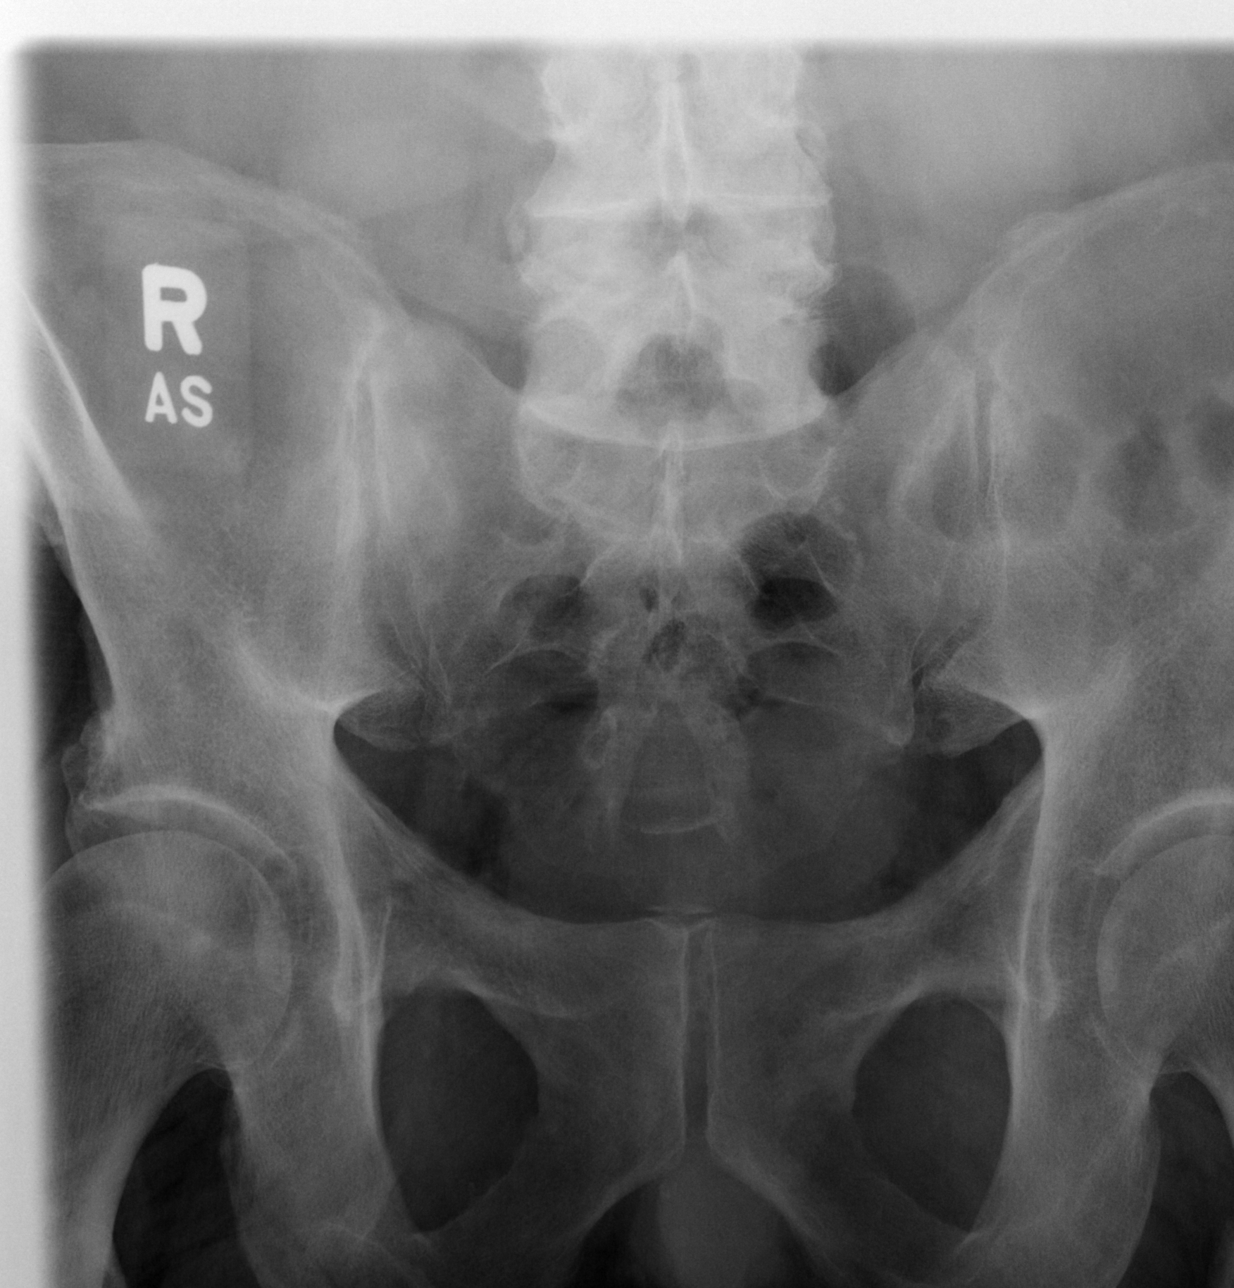

[t coccyx a.p. *]
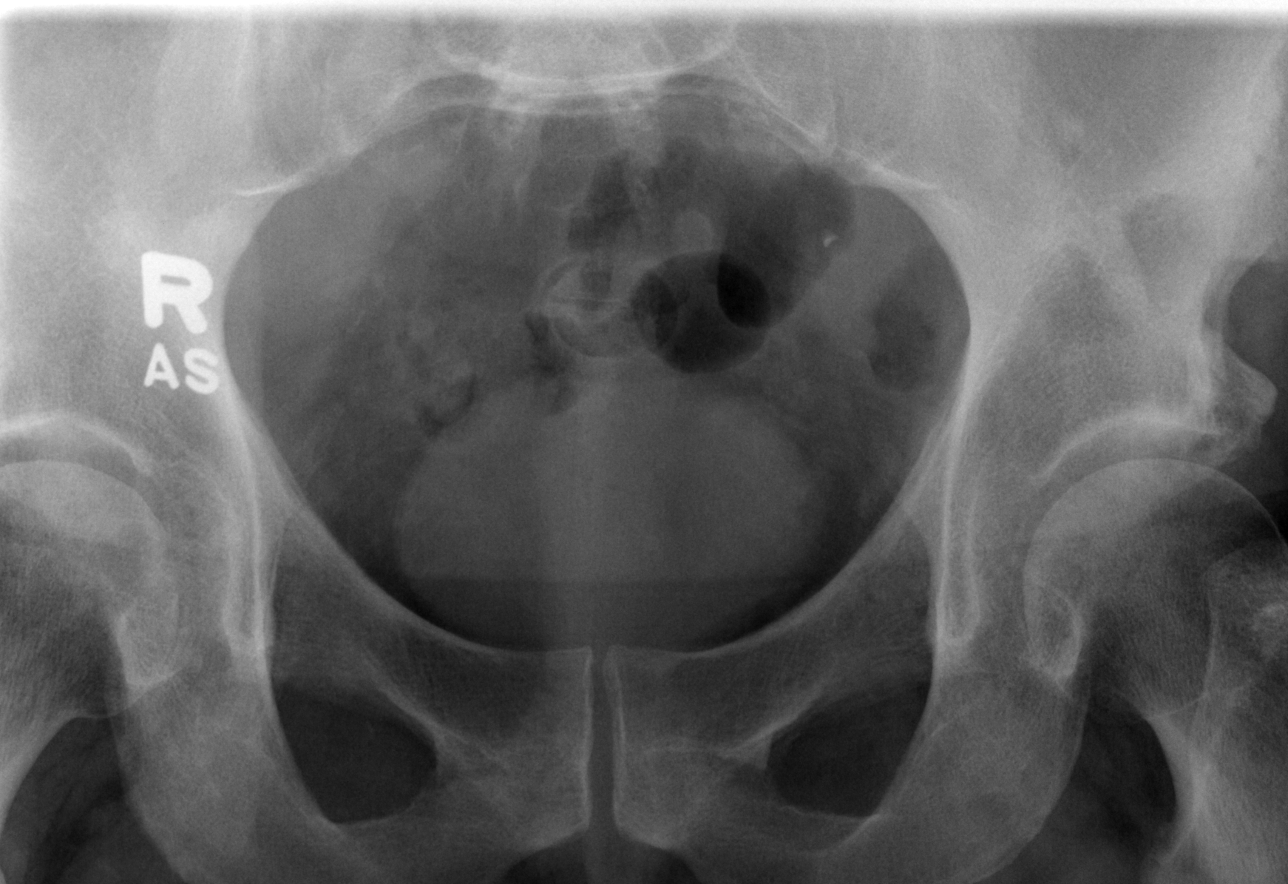

[t coccyx lat]
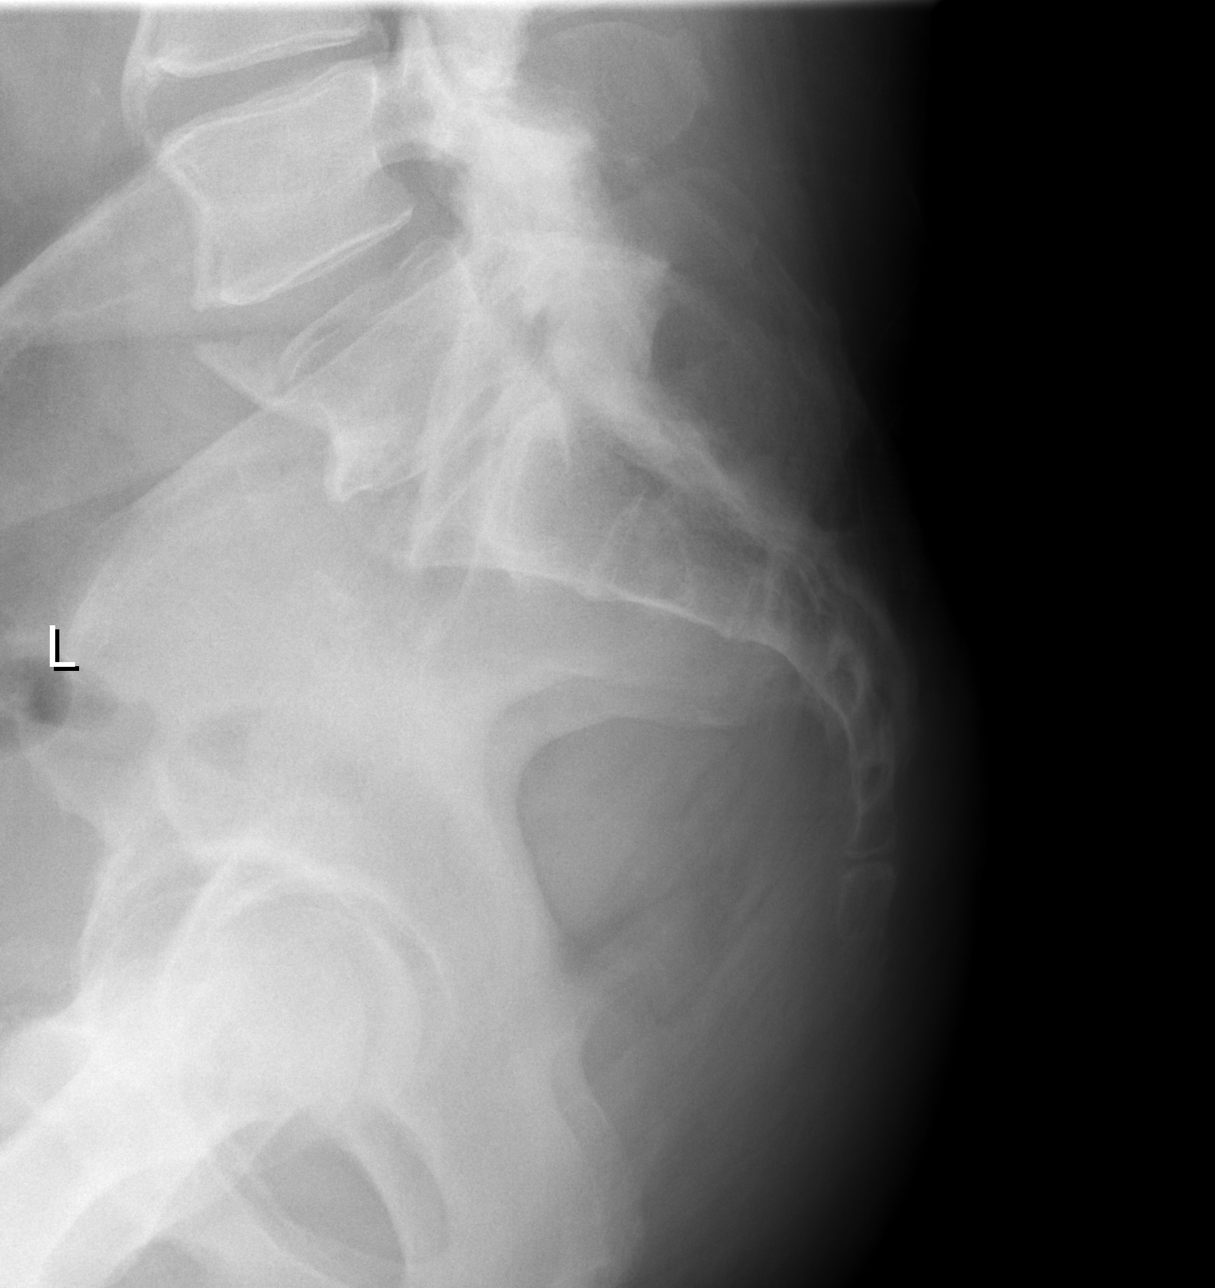

[3 of 3 positions shown; findings below may reference images not displayed]

FINDINGS: There is no evidence of fracture or other focal bone lesions.
IMPRESSION: Negative.

## 2022-10-22 ENCOUNTER — Telehealth: Payer: Self-pay

## 2022-10-22 ENCOUNTER — Ambulatory Visit (INDEPENDENT_AMBULATORY_CARE_PROVIDER_SITE_OTHER): Payer: Medicare PPO | Admitting: Student

## 2022-10-22 ENCOUNTER — Other Ambulatory Visit (HOSPITAL_COMMUNITY): Payer: Self-pay

## 2022-10-22 ENCOUNTER — Other Ambulatory Visit: Payer: Self-pay

## 2022-10-22 ENCOUNTER — Encounter: Payer: Self-pay | Admitting: Student

## 2022-10-22 VITALS — BP 126/70 | HR 76 | Ht 70.0 in | Wt 219.6 lb

## 2022-10-22 DIAGNOSIS — E785 Hyperlipidemia, unspecified: Secondary | ICD-10-CM

## 2022-10-22 DIAGNOSIS — Z23 Encounter for immunization: Secondary | ICD-10-CM | POA: Diagnosis not present

## 2022-10-22 DIAGNOSIS — Z1211 Encounter for screening for malignant neoplasm of colon: Secondary | ICD-10-CM | POA: Diagnosis not present

## 2022-10-22 DIAGNOSIS — E119 Type 2 diabetes mellitus without complications: Secondary | ICD-10-CM | POA: Diagnosis not present

## 2022-10-22 DIAGNOSIS — I1A0 Resistant hypertension: Secondary | ICD-10-CM

## 2022-10-22 DIAGNOSIS — E781 Pure hyperglyceridemia: Secondary | ICD-10-CM | POA: Diagnosis not present

## 2022-10-22 LAB — POCT GLYCOSYLATED HEMOGLOBIN (HGB A1C): HbA1c, POC (controlled diabetic range): 9 % — AB (ref 0.0–7.0)

## 2022-10-22 NOTE — Progress Notes (Unsigned)
    SUBJECTIVE:   CHIEF COMPLAINT / HPI:   T2DM Pt last seen 05/19/2022 with A1c 9.4. Glipizide was switched to short acting 10 mg and Ozempic 3 mg daily and Farxiga 5 mg daily added to regimen however pt states the ozempic and farxiga were too expensive so he has only been taking the Glipizide. Checks blood sugars couple times a week. Fasting sugar have been high 100's and post prandial above 200. A1c today of 9.0. Of note, patient states he cannot tolerate metformin due to diarrhea and refuses to use injectable medications.  HTN Current regimen includes lisinopril 40 mg daily, Lopressor 100 mg 2 tablets BID, spironolactone 25 mg daily, amlodipine 10 mg daily and chlorthalidone 25 mg daily.  Hyperlipidemia Last lipid panel on 05/20/2022 with total cholesterol 206, LDL 91, triglycerides elevated 10/11/2009.  Patient started on Crestor 20 mg daily which he has been taking.  Health Maintenance Wants PCV vaccine today Will get COVID and Tdap at a later day at [place]  PMH / [place]: T2DM, HTN, hyperlipidemia  OBJECTIVE:   Vitals:   10/22/22 0856 10/22/22 0908  BP: (!) 159/79 126/70  Pulse: 76   SpO2: 100%     General: NAD, pleasant, able to participate in exam Cardiac: RRR, no murmurs. Respiratory: CTAB, normal effort, No wheezes, rales or rhonchi Extremities: no edema, no wounds on feet, monofilament testing for sensation normal in bilateral feet, posterior tibial pulses 2+ bilaterally Skin: warm and dry, no rashes noted Neuro: alert, no obvious focal deficits Psych: Normal affect and mood  ASSESSMENT/PLAN:   T2DM (type 2 diabetes mellitus) (HCC) A1c 9, DM uncontrolled.  Limited in treatment options due to patient having side effects with metformin and refusal to do injections.  -Continue Glipizide - I will reach out to pharmacy team about medication options that patient can afford  Resistant hypertension Repeat blood pressure under goal.  Continue current regimen  with lisinopril, Lopressor, spironolactone, amlodipine, and chlorthalidone  Hyperlipidemia -Continue Crestor -Lipid panel today specifically to see if triglycerides have down trended since starting Crestor   Health maintenance PCV vaccine administered today Cologuard ordered  Dr. Precious Gilding, [place]

## 2022-10-22 NOTE — Assessment & Plan Note (Signed)
A1c 9, DM uncontrolled. -Continue Glipizide - I will reach out to pharmacy team about medica

## 2022-10-22 NOTE — Patient Instructions (Addendum)
It was great to see you! Thank you for allowing me to participate in your care!  I recommend that you always bring your medications to each appointment as this makes it easy to ensure you are on the correct medications and helps [place] not miss when refills are needed.  Our plans for today:  - Go to the pharmacy for both Tdap and COVID vaccines - We gave pneumonia vaccine today -I ordered cologaurd test for colon cancer screening. Kit will be mailed to your home - Continue current blood pressure medications - I will look into best options for diabetes medications that you can afford. I will speak with our pharmacy team about this and I will call you with an update.  - call to schedule your diabetic eye exam: [facility] [place] Address: [address] [place], [place] [address] Phone: [phone]  We are checking some labs today, I will call you if they are abnormal will send you a MyChart message or a letter if they are normal.  If you do not hear about your labs in the next 2 weeks please let us know.  Take care and seek immediate care sooner if you develop any concerns.   Dr. Precious Gilding, DO [facility]

## 2022-10-22 NOTE — Telephone Encounter (Signed)
I attempted to call patient to discuss costs of diabetes medications, Farxiga and Ozempic, both covered under patient's insurance with a copay of $11 each. I left patient a voicemail instructing to call back at earliest convenience to discuss medications. Will plan to follow-up early next week if does not call back before then.   Francena Hanly, PharmD Pharmacy Resident  10/22/2022 2:42 PM

## 2022-10-23 LAB — BASIC METABOLIC PANEL
BUN/Creatinine Ratio: 26 — ABNORMAL HIGH (ref 10–24)
BUN: 34 mg/dL — ABNORMAL HIGH (ref 8–27)
CO2: 19 mmol/L — ABNORMAL LOW (ref 20–29)
Calcium: 10.2 mg/dL (ref 8.6–10.2)
Chloride: 102 mmol/L (ref 96–106)
Creatinine, Ser: 1.32 mg/dL — ABNORMAL HIGH (ref 0.76–1.27)
Glucose: 203 mg/dL — ABNORMAL HIGH (ref 70–99)
Potassium: 4.7 mmol/L (ref 3.5–5.2)
Sodium: 137 mmol/L (ref 134–144)
eGFR: 60 mL/min/{1.73_m2} (ref >59)

## 2022-10-23 LAB — LIPID PANEL
Chol/HDL Ratio: 3.4 ratio (ref 0.0–5.0)
Cholesterol, Total: 112 mg/dL (ref 100–199)
HDL: 33 mg/dL — ABNORMAL LOW (ref >39)
LDL Chol Calc (NIH): 38 mg/dL (ref 0–99)
Triglycerides: 271 mg/dL — ABNORMAL HIGH (ref 0–149)
VLDL Cholesterol Cal: 41 mg/dL — ABNORMAL HIGH (ref 5–40)

## 2022-10-23 NOTE — Assessment & Plan Note (Signed)
-  Continue Crestor -Lipid panel today specifically to see if triglycerides have down trended since starting Crestor

## 2022-10-23 NOTE — Assessment & Plan Note (Signed)
Repeat blood pressure under goal.  Continue current regimen with lisinopril, Lopressor, spironolactone, amlodipine, and chlorthalidone

## 2022-10-25 ENCOUNTER — Telehealth: Payer: Self-pay | Admitting: Pharmacist

## 2022-10-25 ENCOUNTER — Other Ambulatory Visit (HOSPITAL_COMMUNITY): Payer: Self-pay

## 2022-10-25 NOTE — Telephone Encounter (Signed)
Called patient to discuss costs of diabetes medications, Farxiga (dapagliflozin) and Rybelsus (semaglutide). Spoke with patient's pharmacy and both medications are covered under patient's insurance with copays of ~$11 each. Medications will be ready for pick up at the [facility] on [address] on Thursday, 10/28/2022.   Patient educated on the appropriate administration of Rybelsus (semaglutide). Patient expressed concerns taking Farxiga (dapagliflozin) due to serious adverse effects read about on the internet and seen on TV. Encouraged patient to bring questions and/or concerns to discuss further during next visit with Dr. Ronnald Ramp on 11/05/2022.   If patient expresses continued hesitancy regarding medications or persistent inadequate control of diabetes, consider pharmacy referral for assistance.   Thanks,  Francena Hanly, PharmD

## 2022-10-28 ENCOUNTER — Telehealth: Payer: Self-pay | Admitting: Student

## 2022-10-28 NOTE — Telephone Encounter (Signed)
Patient Jeffrey Stanton on nurse line in regards to medications.   He reports he has not picked up the new medications yet. 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   Will forward to PCP.   He did state, "I am unable to sit by my phone all day waiting for it to ring."

## 2022-10-28 NOTE — Telephone Encounter (Signed)
Attempted to call patient to discuss recent lab results and to see if he was able to pick up his oral Ozempic and Jardiance from the pharmacy today.  Left voicemail for patient to call back to further discuss.

## 2022-11-03 ENCOUNTER — Telehealth: Payer: Self-pay | Admitting: Student

## 2022-11-03 NOTE — Telephone Encounter (Signed)
Called pt again to discuss recent labs and see if he was able to pick up his medications. I left a voice mail advising him to call back if he would like to speak over the phone or we can chat in person at his next visit on 11/05/22

## 2022-11-04 NOTE — Progress Notes (Signed)
    SUBJECTIVE:   CHIEF COMPLAINT / HPI:   T2DM A1c of 9 at last visit on 10/25/2022.  Patient is currently taking glipizide 10 mg daily.  Ozempic 3 mg daily and Farxiga 5 mg daily were prescribed previously. Patient states he went to the pharmacy to pick them up but they did not have them.  Elevated creatinine Creatinine elevated 1.32 at last visit with GFR of 60.  Patient is already on renoprotective medication including lisinopril.  I have attempted to add [place] as mentioned above.   HTN Patient taking amlodipine 10 mg daily, chlorthalidone 25 mg daily, lisinopril 40 mg daily, metoprolol 100 mg twice daily, spironolactone 25 mg daily.   PERTINENT  PMH / PSH: HTN, T2DM  OBJECTIVE:   BP (!) 148/76   Pulse 72   Ht '5\' 10"'$  (1.778 m)   Wt 219 lb 6.4 oz (99.5 kg)   SpO2 100%   BMI 31.48 kg/m    General: NAD, pleasant, able to participate in exam Cardiac: Well-perfused Respiratory: Breathing comfortably on room air Skin: warm and dry Neuro: alert, no obvious focal deficits Psych: Normal affect and mood  ASSESSMENT/PLAN:   Resistant hypertension BP elevated in the office today, still elevated on repeat but lower.  Will not change anti-hypertensive medications as it is still significantly better than it has been in the past.  Will continue to monitor patient advised to check blood pressure at home if he is able, record them and bring to next appointment in about 2 weeks.  T2DM (type 2 diabetes mellitus) ([place]) I reordered patient's oral Ozempic and Farxiga which he agrees to pick up at the [facility] today.  He will take these in addition to his glipizide 10 mg daily.  If he does well on Ozempic 3 mg daily and Farxiga 5 mg daily for a month, will increase dosages of both and plan to check an A1c in 3 months.  Elevated serum creatinine Creatinine elevated 1.32 at last visit, baseline appears to previously been around 0.8-1.  GFR WNL at 60.  Will monitor kidney function  and recheck BMP in 3 months when he returns for his A1c.  Of note, he is on renal protective medication including lisinopril and Farxiga added today.     Dr. Precious Gilding, [place]

## 2022-11-05 ENCOUNTER — Encounter: Payer: Self-pay | Admitting: Student

## 2022-11-05 ENCOUNTER — Ambulatory Visit (INDEPENDENT_AMBULATORY_CARE_PROVIDER_SITE_OTHER): Payer: Medicare PPO | Admitting: Student

## 2022-11-05 VITALS — BP 148/76 | HR 72 | Ht 70.0 in | Wt 219.4 lb

## 2022-11-05 DIAGNOSIS — I1A0 Resistant hypertension: Secondary | ICD-10-CM | POA: Diagnosis not present

## 2022-11-05 DIAGNOSIS — E119 Type 2 diabetes mellitus without complications: Secondary | ICD-10-CM

## 2022-11-05 DIAGNOSIS — R7989 Other specified abnormal findings of blood chemistry: Secondary | ICD-10-CM | POA: Diagnosis not present

## 2022-11-05 MED ORDER — DAPAGLIFLOZIN PROPANEDIOL 5 MG PO TABS: 5.0000 mg | ORAL_TABLET | Freq: Every day | ORAL | 0 refills | Status: DC

## 2022-11-05 MED ORDER — SEMAGLUTIDE 3 MG PO TABS: 3.0000 mg | ORAL_TABLET | Freq: Every day | ORAL | 0 refills | Status: DC

## 2022-11-05 NOTE — Assessment & Plan Note (Signed)
I reordered patient's oral Ozempic and Wilder Glade which he agrees to pick up at the [facility] today.  He will take these in addition to his glipizide 10 mg daily.  If he does well on Ozempic 3 mg daily and Farxiga 5 mg daily for a month, will increase dosages of both and plan to check an A1c in 3 months.

## 2022-11-05 NOTE — Assessment & Plan Note (Addendum)
BP elevated in the office today, still elevated on repeat but lower.  Will not change anti-hypertensive medications as it is still significantly better than it has been in the past.  Will continue to monitor patient advised to check blood pressure at home if he is able, record them and bring to next appointment in about 2 weeks.

## 2022-11-05 NOTE — Assessment & Plan Note (Signed)
Creatinine elevated 1.32 at last visit, baseline appears to previously been around 0.8-1.  GFR WNL at 60.  Will monitor kidney function and recheck BMP in 3 months when he returns for his A1c.  Of note, he is on renal protective medication including lisinopril and Farxiga added today.

## 2022-11-05 NOTE — Patient Instructions (Addendum)
It was great to see you! Thank you for allowing me to participate in your care!  I recommend that you always bring your medications to each appointment as this makes it easy to ensure you are on the correct medications and helps [place] not miss when refills are needed.  Our plans for today:  - Pick up Semaglutide and farxiga to your pharmacy. Start taking these daily. If you tolerate them well we will increase the dose of both  - Return in 3 months for blood work and sooner if needed - return in 2 weeks for BP follow up  Take care and seek immediate care sooner if you develop any concerns.   Dr. Precious Gilding, DO [facility]

## 2022-11-09 LAB — COLOGUARD: COLOGUARD: POSITIVE — AB

## 2022-11-10 ENCOUNTER — Telehealth: Payer: Self-pay | Admitting: Student

## 2022-11-10 NOTE — Telephone Encounter (Signed)
Attempted to call pt with positive cologaurd results but got voice mail. Left message that I would try calling him again tomorrow.

## 2022-11-11 NOTE — Telephone Encounter (Signed)
Patient returns call to nurse line. He is requesting that provider return call to him at [phone] .   Talbot Grumbling, RN

## 2022-11-12 ENCOUNTER — Telehealth: Payer: Self-pay | Admitting: Student

## 2022-11-12 DIAGNOSIS — Z1211 Encounter for screening for malignant neoplasm of colon: Secondary | ICD-10-CM

## 2022-11-12 NOTE — Telephone Encounter (Signed)
Called pt to discuss positive cologaurd results. We discussed that this does not mean he has colon cancer but that it is important for him to have a colonoscopy to gain more information. He agrees with this. Colonoscopy ordered.

## 2022-11-25 ENCOUNTER — Ambulatory Visit (INDEPENDENT_AMBULATORY_CARE_PROVIDER_SITE_OTHER): Payer: Medicare PPO | Admitting: Family Medicine

## 2022-11-25 VITALS — BP 138/72 | Temp 97.9°F | Ht 70.0 in | Wt 210.4 lb

## 2022-11-25 DIAGNOSIS — J069 Acute upper respiratory infection, unspecified: Secondary | ICD-10-CM | POA: Diagnosis not present

## 2022-11-25 MED ORDER — BENZONATATE 200 MG PO CAPS: 200.0000 mg | ORAL_CAPSULE | Freq: Three times a day (TID) | ORAL | 0 refills | Status: DC | PRN

## 2022-11-25 NOTE — Patient Instructions (Signed)
It was great to meet you!  Your symptoms are most likely from a virus (common cold). This should improve over the next 1 week but the cough can linger for several weeks.  I have sent a prescription to your pharmacy to hopefully help with cough. This medication is deadly for children so be sure to store it and/or dispose of it properly.  In addition, you can try flonase and/or saline nasal spray to help with your congestion.  If you notice worsening shortness of breath, new fever, or other concerns please let us know. If your symptoms are not improved in a few weeks please let us know.   Feel better! Dr Rock Nephew

## 2022-11-25 NOTE — Assessment & Plan Note (Addendum)
Presentation consistent with viral URI.  No red flags on history.  No evidence to suggest bacterial infection such as AOM, sinusitis, strep, or CAP.  Advised supportive care.  Rx sent for Tessalon prn.  Return if no improvement as differential would include adverse effect of lisinopril if cough persists.

## 2022-11-25 NOTE — Progress Notes (Signed)
    SUBJECTIVE:   CHIEF COMPLAINT / HPI:   Cough Symptoms started 1 week ago.  He reports associated nasal congestion and mild sore throat.  Cough is worse at night.  He describes coughing spells/spasms for 10-15 minutes at night.  This has caused slight shortness of breath.  No fevers, GI symptoms, chest pain, leg swelling.  His brother sick with viral URI, saw him for 1 day approximately 1 week before his symptoms started.  Tried Alka-Seltzer cold plus, Listerine, and Coricidin HBP without relief.  PERTINENT  PMH / PSH: HTN, T2DM  OBJECTIVE:   BP 138/72   Temp 97.9 F (36.6 C) (Oral)   Ht 5\' 10"  (1.778 m)   Wt 210 lb 6.4 oz (95.4 kg)   SpO2 99%   BMI 30.19 kg/m   Gen: alert, well-appearing, NAD Head: [place]/AT Eyes: normal sclera and conjunctiva, PERRL Ears: external ears, canals, and TMs normal bilaterally Nose: nares patent, slight inferior turbinate hypertrophy on left Throat: oropharynx unremarkable without tonsillar edema, erythema or exudate Neck: no cervical or supraclavicular lymphadenopathy CV: RRR, normal S1/S2 without m/r/g Resp: normal effort, lungs CTAB  Skin: no rashes on exposed skin Neuro: grossly intact  ASSESSMENT/PLAN:   Viral URI with cough Presentation consistent with viral URI.  No red flags on history.  No evidence to suggest bacterial infection such as AOM, sinusitis, strep, or CAP.  Advised supportive care.  Rx sent for Tessalon prn.  Return if no improvement as differential would include adverse effect of lisinopril if cough persists.    Alcus Dad, MD [place]

## 2022-11-28 ENCOUNTER — Other Ambulatory Visit: Payer: Self-pay | Admitting: Student

## 2022-11-28 DIAGNOSIS — I10 Essential (primary) hypertension: Secondary | ICD-10-CM

## 2022-12-09 ENCOUNTER — Telehealth: Payer: Self-pay | Admitting: Student

## 2022-12-09 NOTE — Telephone Encounter (Signed)
Called patient to discuss his blood pressure recordings he had dropped off at the office for me to review.  He did not pick up.  I did leave voicemail that according to the records he left me, he should continue his current medications.   BP readings for a week ranged from 128-146 (mostly in 120s-130s)/75-88.   Advised patient to call our office if he wants to further discuss.

## 2022-12-23 ENCOUNTER — Other Ambulatory Visit: Payer: Self-pay | Admitting: Student

## 2022-12-23 ENCOUNTER — Other Ambulatory Visit: Payer: Self-pay | Admitting: Family Medicine

## 2022-12-23 DIAGNOSIS — I10 Essential (primary) hypertension: Secondary | ICD-10-CM

## 2023-01-18 ENCOUNTER — Encounter: Payer: Self-pay | Admitting: Student

## 2023-01-18 ENCOUNTER — Ambulatory Visit (INDEPENDENT_AMBULATORY_CARE_PROVIDER_SITE_OTHER): Payer: Medicare PPO | Admitting: Student

## 2023-01-18 ENCOUNTER — Other Ambulatory Visit: Payer: Self-pay

## 2023-01-18 VITALS — BP 141/76 | HR 79 | Ht 70.0 in | Wt 208.2 lb

## 2023-01-18 DIAGNOSIS — E119 Type 2 diabetes mellitus without complications: Secondary | ICD-10-CM

## 2023-01-18 DIAGNOSIS — I1A0 Resistant hypertension: Secondary | ICD-10-CM

## 2023-01-18 DIAGNOSIS — R7989 Other specified abnormal findings of blood chemistry: Secondary | ICD-10-CM | POA: Diagnosis not present

## 2023-01-18 DIAGNOSIS — I951 Orthostatic hypotension: Secondary | ICD-10-CM | POA: Diagnosis not present

## 2023-01-18 DIAGNOSIS — R634 Abnormal weight loss: Secondary | ICD-10-CM

## 2023-01-18 DIAGNOSIS — E118 Type 2 diabetes mellitus with unspecified complications: Secondary | ICD-10-CM | POA: Diagnosis not present

## 2023-01-18 DIAGNOSIS — R42 Dizziness and giddiness: Secondary | ICD-10-CM | POA: Diagnosis not present

## 2023-01-18 LAB — POCT GLYCOSYLATED HEMOGLOBIN (HGB A1C): HbA1c, POC (controlled diabetic range): 5.9 % (ref 0.0–7.0)

## 2023-01-18 MED ORDER — BLOOD GLUCOSE MONITOR KIT
PACK | 0 refills | Status: AC
Start: 2023-01-18 — End: ?

## 2023-01-18 MED ORDER — BLOOD GLUCOSE MONITOR KIT: PACK | 0 refills | Status: DC

## 2023-01-18 MED ORDER — METOPROLOL TARTRATE 50 MG PO TABS
25.0000 mg | ORAL_TABLET | Freq: Two times a day (BID) | ORAL | 0 refills | Status: DC
Start: 2023-01-18 — End: 2023-01-25

## 2023-01-18 NOTE — Progress Notes (Unsigned)
    SUBJECTIVE:   CHIEF COMPLAINT / HPI:   T2DM Last A1c of 9 on 10/25/2022. He is currently taking oral Ozempic 3 mg daily, Farxiga 5 mg daily, and glipizide 2 mg daily.  He has not been checking blood sugars at home because he lost his meter.  Falls Has fallen about 6x in past couple weeks d/t feeling somewhat light headed. Falling doesn't occur immediately after standing. Has not hit his head during any falls, no LOC, No injuries to any part of his body. He feels like his R leg is sometimes giving out on him.  States he falls carefully, knows how to fall without injuring himself.  Weight loss  Abdominal pain Has lost 11 pounds since February. Weight loss is unintentional but does state food does not taste good anymore.  He used to eat ice cream a couple x/week but stopped because it caused diarrhea. Has seen a little red blood on toilet paper after BM, not every time, he thinks it may be d/t hemorrhoids.  Has had intermittent abdominal pain, worsening over past week. States it is never in the same place, is currently in RUQ.  The pain is worse sometimes with movement but resolves within a minutes. He denies nausea and vomiting.  Of note, had positive Cologuard test and was referred to GI for colonoscopy, stating he never heard from them.  On chart review, they sent him a letter yesterday stating they have tried to reach him multiple times.  HTN Has been checking BP at home, running low 100's - 140's (mostly in 1-teens)/ 60's-80's.   PERTINENT  PMH / PSH: Resistant HTN, T2DM, depression  OBJECTIVE:   BP (!) 141/76   Pulse 79   Ht 5\' 10"  (1.778 m)   Wt 208 lb 3.2 oz (94.4 kg)   SpO2 100%   BMI 29.87 kg/m    Orthostatic vitals: Lying 129/79 Sitting 136/80 Standing 85/50  General: pleasant, able to participate in exam, NAD Cardiac: RRR, no murmurs. Respiratory: CTAB, normal effort, No wheezes, rales or rhonchi Abdomen: Bowel sounds present, nondistended, soft, mild tenderness to  palpation of right lower quadrant without guarding MSK no edema of BLEs.  5/5 muscle strength of BLEs and BUEs Skin: warm and dry Neuro: Head and arms shaking intermittently during exam.  No resting tremor, no essential tremor.  Cranial nerves II through XII intact, sensation intact, finger-nose-finger test normal. 2+ patellar reflexes bilaterally Psych: Normal affect and mood  ASSESSMENT/PLAN:   No problem-specific Assessment & Plan notes found for this encounter.     Dr. Erick Alley, DO [facility]    {    This will disappear when note is signed, click to select method of visit    :1}

## 2023-01-18 NOTE — Assessment & Plan Note (Addendum)
Due to positive orthostatic vitals, we will D/C chlorthalidone and decrease metoprolol from 100 mg twice daily to 25 mg twice daily. -f/u in 1 week with Dr. Idalia Needle -schedule apt w/ Dr. Raymondo Band for ambulatory BP monitoring

## 2023-01-18 NOTE — Patient Instructions (Addendum)
It was great to see you! Thank you for allowing me to participate in your care!  I recommend that you always bring your medications to each appointment as this makes it easy to ensure you are on the correct medications and helps [place] not miss when refills are needed.  Our plans for today:  MEDICATION CHANGES: Because of you blood pressure dropping so low when you stand up (orthostatic hypotension), we are going to stop and lower some of your blood pressure medications: STOP taking chlorthalidone 25 mg ([place]) DECREASE metoprolol to 25 mg twice a day (Lopressor) This has been sent to your pharmacy. The tablets will need to be halfed (they are 50 mg) - Please call [facility] to schedule your colonoscopy: [phone].  - I sent in a an order for a new blood sugar meter kit. Please check fasting blood sugars daily and bring readings to your next appointment - Please schedule an appointment with Dr. Raymondo Band for ambulatory blood pressure monitoring   -Return in 1 week to follow up with Dr. Idalia Needle, I will talk with her about your care -If you become very light headed, have chest pain or shortness of breath, go to the emergency department    We are checking some labs today, I will call you if they are abnormal will send you a MyChart message or a letter if they are normal.  If you do not hear about your labs in the next 2 weeks please let us know.   Take care and seek immediate care sooner if you develop any concerns.   Dr. Erick Alley, DO [facility]  D/t orthostatic hypotension: -avoid dehydration. Often it requires high volumes of fluids, often with salt/electrolytes included, to stay hydrated. People with orthostasis are very sensitive to fluid shifts and dehydration. Oral rehydration is preferred, and routine use of IV fluids is not recommended. -if tolerated, compression stocking can assist with fluid management and prevent pooling in the legs. -slow position changes are  recommended -if there is a feeling of severe lightheadedness, like near to passing out, recommend lying on the floor on the back, with legs elevated up on a chair or up against the wall. -the best long term management is gradual exercise conditioning. I recommend seated exercises such as bike to start, to avoid the risk of falling with lightheadedness. Exercise programs, either through supervised programs like cardiac rehab or through personal programs, should focus on gradually increasing exercise tolerance and conditioning.

## 2023-01-19 DIAGNOSIS — R634 Abnormal weight loss: Secondary | ICD-10-CM | POA: Insufficient documentation

## 2023-01-19 DIAGNOSIS — I951 Orthostatic hypotension: Secondary | ICD-10-CM | POA: Insufficient documentation

## 2023-01-19 LAB — COMPREHENSIVE METABOLIC PANEL
ALT: 18 IU/L (ref 0–44)
AST: 17 IU/L (ref 0–40)
Albumin/Globulin Ratio: 1.7 (ref 1.2–2.2)
Albumin: 4.7 g/dL (ref 3.9–4.9)
Alkaline Phosphatase: 53 IU/L (ref 44–121)
BUN/Creatinine Ratio: 24 (ref 10–24)
BUN: 29 mg/dL — ABNORMAL HIGH (ref 8–27)
Bilirubin Total: 0.2 mg/dL (ref 0.0–1.2)
CO2: 22 mmol/L (ref 20–29)
Calcium: 9.5 mg/dL (ref 8.6–10.2)
Chloride: 103 mmol/L (ref 96–106)
Creatinine, Ser: 1.2 mg/dL (ref 0.76–1.27)
Globulin, Total: 2.8 g/dL (ref 1.5–4.5)
Glucose: 123 mg/dL — ABNORMAL HIGH (ref 70–99)
Potassium: 3.9 mmol/L (ref 3.5–5.2)
Sodium: 139 mmol/L (ref 134–144)
Total Protein: 7.5 g/dL (ref 6.0–8.5)
eGFR: 67 mL/min/{1.73_m2} (ref >59)

## 2023-01-19 LAB — CBC
Hematocrit: 35.1 % — ABNORMAL LOW (ref 37.5–51.0)
Hemoglobin: 11.2 g/dL — ABNORMAL LOW (ref 13.0–17.7)
MCH: 25.7 pg — ABNORMAL LOW (ref 26.6–33.0)
MCHC: 31.9 g/dL (ref 31.5–35.7)
MCV: 81 fL (ref 79–97)
Platelets: 201 10*3/uL (ref 150–450)
RBC: 4.36 x10E6/uL (ref 4.14–5.80)
RDW: 14.5 % (ref 11.6–15.4)
WBC: 6.6 10*3/uL (ref 3.4–10.8)

## 2023-01-19 LAB — TSH: TSH: 0.902 u[IU]/mL (ref 0.450–4.500)

## 2023-01-19 NOTE — Assessment & Plan Note (Addendum)
I am concerned about the possibility of colon cancer with weight loss in addition to abdominal pain, hematochezia, positive Cologuard test along with new orthostatic hypotension.  -Patient given phone number for [facility] to schedule colonoscopy ASAP -CBC  -CMP  -TSH

## 2023-01-19 NOTE — Assessment & Plan Note (Addendum)
This is most likely the cause of his falls as he went from SBP 136 sitting to 85 standing. Patient given information on lifestyle management for orthostatic hypotension.  Medication changes as mentioned above.

## 2023-01-19 NOTE — Assessment & Plan Note (Signed)
A1c significantly decreased from 9-5.9 since medications were altered at last visit.  Can also consider decreased appetite and eating less to be contributing to lower A1c.  Would not change any medications at this time but continue to monitor.  New glucose meter kit was sent to pharmacy and patient advised to check fasting sugars once daily and bring this to his next appointment.

## 2023-01-20 ENCOUNTER — Telehealth: Payer: Self-pay | Admitting: Student

## 2023-01-20 NOTE — Telephone Encounter (Signed)
Called pt to discuss recent lab results showing a normocytic anemia with Hgb 11.2, down from 13.7 about 8 months ago. Pt agrees with plan of getting additional iron studies at up coming visit to further work up cause of anemia and plans to proceed with scheduling colonoscopy.

## 2023-01-25 ENCOUNTER — Ambulatory Visit (INDEPENDENT_AMBULATORY_CARE_PROVIDER_SITE_OTHER): Payer: Medicare PPO | Admitting: Family Medicine

## 2023-01-25 ENCOUNTER — Other Ambulatory Visit: Payer: Self-pay

## 2023-01-25 ENCOUNTER — Encounter: Payer: Self-pay | Admitting: Family Medicine

## 2023-01-25 VITALS — BP 139/86 | HR 102 | Ht 70.0 in | Wt 207.8 lb

## 2023-01-25 DIAGNOSIS — I1A0 Resistant hypertension: Secondary | ICD-10-CM | POA: Diagnosis not present

## 2023-01-25 DIAGNOSIS — I951 Orthostatic hypotension: Secondary | ICD-10-CM | POA: Diagnosis not present

## 2023-01-25 MED ORDER — METOPROLOL TARTRATE 50 MG PO TABS: 50.0000 mg | ORAL_TABLET | Freq: Two times a day (BID) | ORAL | 0 refills | Status: DC

## 2023-01-25 NOTE — Patient Instructions (Addendum)
It was great seeing you today!  You came in for a blood pressure follow up and given your heart rate is still elevated and your blood pressure is on the lower side at home we will increase your Metoprolol to 50 twice a day and you can stop taking spironolactone.   Please check-out at the front desk before leaving the clinic. Please see your PCP in 1 week to follow up, but if you need to be seen earlier than that for any new issues we're happy to fit you in, just give [place] a call!  Visit Reminders: - Stop by the pharmacy to pick up your prescriptions  - Continue to work on your healthy eating habits and incorporating exercise into your daily life.   Feel free to call with any questions or concerns at any time, at [phone].   Take care,  Dr. Cora Collum [facility]

## 2023-01-25 NOTE — Assessment & Plan Note (Addendum)
BP 139/86. BP has ranged from 86/54- 128/73. Currently on amlodipine 10 mg, lisinopril 40 mg, spironolactone 25 mg daily, metoprolol 25 BID (recently reduced from 100BID) and recently discontinued chlorthalidone. Given low BPS and continued dizziness and orthostatic hypotension with d/c spironolactone. HR still elevated at home and in clinic so will increase Metoprolol to 50mg  BID. Will also refer to vestibular rehab. Follow up in 1 week.  - urine ACR - referral to vestibular rehab  - d/c spiro and increase metoprolol to 50mg  BID

## 2023-01-25 NOTE — Progress Notes (Signed)
    SUBJECTIVE:   CHIEF COMPLAINT / HPI:   Patient presents for blood pressure follow up.  Was seen on 5/14 and due to falls and orthostatic hypotension, hypertension meds were adjusted and chlorthalidone was discontinued and metoprolol was decreased from 100mg  BID to 25mg  BID.   States he is still having dizzy spells. Head starts to get woozy. Feels in his head a person stomping in his head. Not painful but aggravating. States this has been going on for a couple of months. Currently not doing it but has happened the last few days. Does occasionally feel like the room is spinning. No falls since last visit but a couple of close calls.   States he can feel his heart racing sometimes. Currently no chest pain   Fasting sugars for the past week have ranged from 125-187 States he is supposed to see pharmacy team for his diabetes but wants to deal with wooziness before he talks to them and before he gets his colonoscopy   PERTINENT  PMH / PSH: Reviewed   OBJECTIVE:   BP 139/86   Pulse (!) 102   Ht 5\' 10"  (1.778 m)   Wt 207 lb 12.8 oz (94.3 kg)   SpO2 100%   BMI 29.82 kg/m    Physical exam General: well appearing, NAD Cardiovascular: Tachycardic, no murmurs Lungs: CTAB. Normal WOB Abdomen: soft, non-distended, non-tender Skin: warm, dry. No edema   ASSESSMENT/PLAN:   Orthostatic hypotension BP 139/86. BP has ranged from 86/54- 128/73. Currently on amlodipine 10 mg, lisinopril 40 mg, spironolactone 25 mg daily, metoprolol 25 BID (recently reduced from 100BID) and recently discontinued chlorthalidone. Given low BPS and continued dizziness and orthostatic hypotension with d/c spironolactone. HR still elevated at home and in clinic so will increase Metoprolol to 50mg  BID. Will also refer to vestibular rehab. Follow up in 1 week.  - urine ACR - referral to vestibular rehab  - d/c spiro and increase metoprolol to 50mg  BID     Cora Collum, DO [facility]

## 2023-01-26 LAB — MICROALBUMIN / CREATININE URINE RATIO
Creatinine, Urine: 116.5 mg/dL
Microalb/Creat Ratio: 60 mg/g creat — ABNORMAL HIGH (ref 0–29)
Microalbumin, Urine: 69.5 ug/mL

## 2023-02-08 ENCOUNTER — Encounter: Payer: Self-pay | Admitting: Family Medicine

## 2023-02-08 ENCOUNTER — Other Ambulatory Visit: Payer: Self-pay

## 2023-02-08 ENCOUNTER — Ambulatory Visit (INDEPENDENT_AMBULATORY_CARE_PROVIDER_SITE_OTHER): Payer: Medicare PPO | Admitting: Family Medicine

## 2023-02-08 VITALS — BP 150/80 | HR 97 | Ht 70.0 in | Wt 210.8 lb

## 2023-02-08 DIAGNOSIS — I1A0 Resistant hypertension: Secondary | ICD-10-CM | POA: Diagnosis not present

## 2023-02-08 DIAGNOSIS — R1011 Right upper quadrant pain: Secondary | ICD-10-CM

## 2023-02-08 DIAGNOSIS — I951 Orthostatic hypotension: Secondary | ICD-10-CM

## 2023-02-08 MED ORDER — METOPROLOL TARTRATE 50 MG PO TABS: 50.0000 mg | ORAL_TABLET | Freq: Two times a day (BID) | ORAL | 2 refills | Status: DC

## 2023-02-08 MED ORDER — METOPROLOL TARTRATE 50 MG PO TABS
50.0000 mg | ORAL_TABLET | Freq: Two times a day (BID) | ORAL | 0 refills | Status: DC
Start: 2023-02-08 — End: 2023-02-08

## 2023-02-08 NOTE — Assessment & Plan Note (Signed)
BP on recheck 150/80. Current regimen includes Amlodipine 10 mg and Lisinopril 40 mg daily, as well as Metoprolol 50mg  BID. Given orthostatic hypotension and normotensive home BP measurements (scanned in media portion of chart) will continue current regimen. Recommended continuing to check at home and to let us know if elevated. Will continue to monitor and adjust medication as tolerated.

## 2023-02-08 NOTE — Assessment & Plan Note (Signed)
Patient presents with about 2 months of intermittent RUQ burning/pain. Exam significant for RUQ tenderness with guarding and positive Murphys sign. Considering pleuritic causes of pain such as PE, effusion (reassuring that vitals are stable and with good O2 sat) as well as cholecystitis, cholelithiasis. Could also be MSK related.  Given positive Cologuard also concerned about colon cancer though would not expect this type of pain.  Will check RUQ [place] to look at gallbladder as well as CXR to assess for potential effusion. Advised him to schedule for his colonoscopy with GI. Order previously placed.  - f/u RUQ [place] and CXR

## 2023-02-08 NOTE — Patient Instructions (Addendum)
It was great seeing you today!  We are going to check an abdominal ultrasound and chest x ray to see if we can find a cause of your pain. You can get both done Thursday morning at 7:30am at [facility] hospital.  I still also recommend contacting GI to get your colonoscopy scheduled as soon as you are able to.  I will call you once I get both of your results back.  Continue to check your blood pressure at home, and if it is persistently elevated above 130/80 please let us know and we can adjust medication further.  Feel free to call with any questions or concerns at any time, at [phone].   Take care,  Dr. Cora Collum [facility]

## 2023-02-08 NOTE — Progress Notes (Signed)
    SUBJECTIVE:   CHIEF COMPLAINT / HPI:   Patient presents for blood pressure follow-up.  Was seen about 2 weeks ago for the static hypotension, had blood pressure medications adjusted.  Metoprolol was increased from 25 mg twice daily to 50 mg twice daily. Spironolactone was discontinued.  Was continued on amlodipine 10 mg and lisinopril 40 mg daily. States his dizziness has gotten better since these med changes. Has not had any falls. Has not heard back about vestibular rehab   Has pain and burning in RUQ. States it worsens if he coughs, sneezes, blows his nose. When sneezes its a sharp quick pain that lasts a second. Denies it worsening with food. Denies fevers. Denies diarrhea. Since starting Farxiga and Rebelsyus Bms have not been regular. Has had blood in stool, had positive cologuard in Feb. Has not scheduled for his colonoscopy due to family issues.   PERTINENT  PMH / PSH: Reviewed   OBJECTIVE:   BP (!) 150/80   Pulse 97   Ht 5\' 10"  (1.778 m)   Wt 210 lb 12.8 oz (95.6 kg)   SpO2 100%   BMI 30.25 kg/m    Physical exam General: well appearing, NAD Cardiovascular: RRR, no murmurs Lungs: CTAB. Normal WOB Abdomen: soft, non-distended,tender to palpation in RUQ with positive murphys sign. Mild tensing when palpating right and left upper quadrants Skin: warm, dry. No edema  ASSESSMENT/PLAN:   Orthostatic hypotension BP on recheck 150/80. Current regimen includes Amlodipine 10 mg and Lisinopril 40 mg daily, as well as Metoprolol 50mg  BID. Given orthostatic hypotension and normotensive home BP measurements (scanned in media portion of chart) will continue current regimen. Recommended continuing to check at home and to let us know if elevated. Will continue to monitor and adjust medication as tolerated.   RUQ pain Patient presents with about 2 months of intermittent RUQ burning/pain. Exam significant for RUQ tenderness with guarding and positive Murphys sign. Considering pleuritic  causes of pain such as PE, effusion (reassuring that vitals are stable and with good O2 sat) as well as cholecystitis, cholelithiasis. Could also be MSK related.  Given positive Cologuard also concerned about colon cancer though would not expect this type of pain.  Will check RUQ [place] to look at gallbladder as well as CXR to assess for potential effusion. Advised him to schedule for his colonoscopy with GI. Order previously placed.  - f/u RUQ [place] and CXR    Cora Collum, DO [facility]

## 2023-02-10 ENCOUNTER — Other Ambulatory Visit (HOSPITAL_COMMUNITY): Payer: Medicare PPO

## 2023-03-04 ENCOUNTER — Other Ambulatory Visit: Payer: Self-pay | Admitting: Student

## 2023-03-04 DIAGNOSIS — I10 Essential (primary) hypertension: Secondary | ICD-10-CM

## 2023-04-12 ENCOUNTER — Encounter: Payer: Self-pay | Admitting: Family Medicine

## 2023-04-12 ENCOUNTER — Ambulatory Visit (INDEPENDENT_AMBULATORY_CARE_PROVIDER_SITE_OTHER): Payer: Medicare PPO | Admitting: Family Medicine

## 2023-04-12 VITALS — BP 148/76 | HR 100 | Ht 70.0 in | Wt 216.0 lb

## 2023-04-12 DIAGNOSIS — R600 Localized edema: Secondary | ICD-10-CM | POA: Insufficient documentation

## 2023-04-12 MED ORDER — FUROSEMIDE 20 MG PO TABS
20.0000 mg | ORAL_TABLET | Freq: Every day | ORAL | 0 refills | Status: DC
Start: 2023-04-12 — End: 2023-04-20

## 2023-04-12 NOTE — Progress Notes (Signed)
    SUBJECTIVE:   CHIEF COMPLAINT / HPI:   Bilateral Foot and ankle swelling   Had to keep walking from parking garage to 3W for 2 weeks. Mom was in the hospital. He said it got harder to the walk.  Has gotten worse now and he is not even having to walk any more. Painful mildly when sitting, hurts much more when walking. When he steps on something it hurts way more than it would have normally.   He says he has always slept on a wedge for about 40 years. He says it has gotten harder to walk far distances without getting winded. This has been new within the last month or so. 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   Patient mentions he has gained weight though he has not been eating more. Denies being constipated.   PERTINENT  PMH / PSH: HTN, T2DM, HLD,   OBJECTIVE:   BP (!) 148/76   Pulse 100   Ht 5\' 10"  (1.778 m)   Wt 216 lb (98 kg)   SpO2 100%   BMI 30.99 kg/m   General: well appearing, in no acute distress CV: RRR, radial pulses equal and palpable, 1+ pitting edema up to mid shins.  Resp: Normal work of breathing on room air, Diminished breath sounds at the bases Abd: Soft, non tender, non distended  Neuro: Alert & Oriented x 4    ASSESSMENT/PLAN:   Bilateral lower extremity edema Assessment & Plan: New lower extremity edema with dyspnea concerning for new onset heart failure. Could also be nephrotic syndrome; however, with risk factors including hypertension and HLD most likely heart failure. Patient is already on SGLT2, beta blocker, and ACE.  - ECHO  - lasix 20 mg daily  - follow up in one week consider BNP at that visit.   Orders: -     Basic metabolic panel -     Urinalysis, Routine w reflex microscopic -     ECHOCARDIOGRAM COMPLETE; Future -     Furosemide; Take 1 tablet (20 mg total) by mouth daily.  Dispense: 30 tablet; Refill: 0      Jeffrey Mola, MD [facility]

## 2023-04-12 NOTE — Assessment & Plan Note (Signed)
New lower extremity edema with dyspnea concerning for new onset heart failure. Could also be nephrotic syndrome; however, with risk factors including hypertension and HLD most likely heart failure. Patient is already on SGLT2, beta blocker, and ACE.  - ECHO  - lasix 20 mg daily  - follow up in one week consider BNP at that visit.

## 2023-04-12 NOTE — Patient Instructions (Signed)
It was wonderful to see you today.  Please bring ALL of your medications with you to every visit.   Today we talked about:  Lower extremity - We will work up if this is related to your heart or kidneys. I have gotten some labs and will let you know the results. The nurses will let you know about scheduling the the ultrasound of your heart   Thank you for choosing [facility].   Please call [phone] with any questions about today's appointment.  Lockie Mola, MD  Family Medicine

## 2023-04-15 ENCOUNTER — Ambulatory Visit (HOSPITAL_COMMUNITY)
Admission: RE | Admit: 2023-04-15 | Discharge: 2023-04-15 | Disposition: A | Discharge status: Home / Self Care | Payer: Medicare PPO | Source: Ambulatory Visit | Attending: Family Medicine | Admitting: Family Medicine

## 2023-04-15 DIAGNOSIS — I3139 Other pericardial effusion (noninflammatory): Secondary | ICD-10-CM | POA: Insufficient documentation

## 2023-04-15 DIAGNOSIS — E785 Hyperlipidemia, unspecified: Secondary | ICD-10-CM | POA: Diagnosis not present

## 2023-04-15 DIAGNOSIS — I10 Essential (primary) hypertension: Secondary | ICD-10-CM | POA: Diagnosis not present

## 2023-04-15 DIAGNOSIS — R0609 Other forms of dyspnea: Secondary | ICD-10-CM | POA: Diagnosis not present

## 2023-04-15 DIAGNOSIS — E119 Type 2 diabetes mellitus without complications: Secondary | ICD-10-CM | POA: Diagnosis not present

## 2023-04-15 DIAGNOSIS — R600 Localized edema: Secondary | ICD-10-CM

## 2023-04-15 MED ORDER — PERFLUTREN LIPID MICROSPHERE
1.0000 mL | INTRAVENOUS | Status: AC | PRN
  Administered 2023-04-15: 4 mL via INTRAVENOUS

## 2023-04-20 ENCOUNTER — Telehealth: Payer: Self-pay | Admitting: Family Medicine

## 2023-04-20 DIAGNOSIS — I5020 Unspecified systolic (congestive) heart failure: Secondary | ICD-10-CM

## 2023-04-20 MED ORDER — FUROSEMIDE 40 MG PO TABS
40.0000 mg | ORAL_TABLET | Freq: Every day | ORAL | 0 refills | Status: DC
Start: 2023-04-20 — End: 2023-06-24

## 2023-04-20 MED ORDER — POTASSIUM CHLORIDE CRYS ER 20 MEQ PO TBCR: 20.0000 meq | EXTENDED_RELEASE_TABLET | ORAL | 0 refills | Status: DC

## 2023-04-20 NOTE — Telephone Encounter (Signed)
Called patient to discuss ECHO findings and his symptoms. I explained to him that he has heart failure and that his EF was 30-35%. I told him this was most likely the cause of his dyspnea and BLE edema.  He said his symptoms had not Improved much with lasix 20 and that he had not been voiding much more than normal.   I told him I would prescribe lasix 40 and some potassium daily. He has follow up with his PCP on Monday 8/19. I also told him that we would put in a referral for cardiology (placed)   Lockie Mola, MD

## 2023-04-25 ENCOUNTER — Ambulatory Visit: Payer: Self-pay | Admitting: Student

## 2023-05-09 ENCOUNTER — Other Ambulatory Visit: Payer: Self-pay | Admitting: Student

## 2023-05-09 DIAGNOSIS — E119 Type 2 diabetes mellitus without complications: Secondary | ICD-10-CM

## 2023-06-08 ENCOUNTER — Other Ambulatory Visit: Payer: Self-pay | Admitting: Student

## 2023-06-08 DIAGNOSIS — I10 Essential (primary) hypertension: Secondary | ICD-10-CM

## 2023-06-08 DIAGNOSIS — E119 Type 2 diabetes mellitus without complications: Secondary | ICD-10-CM

## 2023-06-22 ENCOUNTER — Ambulatory Visit: Payer: Medicare PPO | Admitting: Student

## 2023-06-23 NOTE — Progress Notes (Signed)
    SUBJECTIVE:   CHIEF COMPLAINT / HPI:   HFrEF Echo 04/15/23 with EF 30-35% Was taking lasix 20 mg daily and increased to 40mg  daily but states the lasix was making him itch and he hasn't taken it in 4 days. States it did make him urinate a lot.  He thinks he actually was urinating more than usual on the 20 mg, unsure if he needed the 40 mg. Is down 15 pounds since last visit on 04/12/2023 when he was prescribed Lasix initially. Denies any shortness of breath.  Does admit to BLE edema which improves after he lies down and is worse at the end of the day.  Weight loss  abdominal pain  positive Cologuard Lost >10 pounds over the course of a few months earlier this year.  Did not gain weight even in the setting of untreated HFrEF prior to August.  Did lose 15 pounds since last visit but this is in the setting of taking Lasix regularly with the exception of the last 4 days.  Patient has not yet scheduled colonoscopy and states he really does not want one.   PERTINENT  PMH / PSH: T2DM, HTN, orthostatic hypotension, HFrEF  OBJECTIVE:   BP 136/74   Pulse 94   Ht 5\' 10"  (1.778 m)   Wt 201 lb 6.4 oz (91.4 kg)   SpO2 100%   BMI 28.90 kg/m    General: NAD, pleasant, able to participate in exam Cardiac: RRR, no murmurs. Respiratory: CTAB, normal effort, No wheezes, rales or rhonchi Abdomen: Bowel sounds present, mild diffuse tenderness to palpation without guarding, soft, nondistended Extremities:  2+ pitting edema LLE, 1+ pitting edema RLE.  No tenderness to palpation, no erythema of BLEs Skin: warm and dry Neuro: alert, no obvious focal deficits Psych: Normal affect and mood  ASSESSMENT/PLAN:   HFrEF (heart failure with reduced ejection fraction) (HCC) Mild pitting edema BLEs but breathing comfortably on room air with clear lungs. -Rx torsemide 10 mg daily, can increase to 20 mg if needed -Rx potassium 20 mEq daily -BMP and mag today -Advised patient to return next week to monitor  labs, thoroughly discussed the importance of this -New cardiology referral placed to Dr. Chilton Si per patient request  Positive colorectal cancer screening using Cologuard test I am concerned for colon cancer given weight loss over the last several months, abdominal pain with positive Cologuard.  Hemoglobin was a little low in May at 11.5.  Has not yet had colonoscopy though we have discussed the importance of this several times. -Advised to schedule colonoscopy ASAP -CBC with differential today   Patient to return next week for labs and to f/u T2DM if appropriate  Dr. Erick Alley, DO [place] [facility]

## 2023-06-24 ENCOUNTER — Encounter: Payer: Self-pay | Admitting: Student

## 2023-06-24 ENCOUNTER — Ambulatory Visit (INDEPENDENT_AMBULATORY_CARE_PROVIDER_SITE_OTHER): Payer: Medicare PPO | Admitting: Student

## 2023-06-24 ENCOUNTER — Other Ambulatory Visit: Payer: Self-pay

## 2023-06-24 VITALS — BP 136/74 | HR 94 | Ht 70.0 in | Wt 201.4 lb

## 2023-06-24 DIAGNOSIS — D649 Anemia, unspecified: Secondary | ICD-10-CM | POA: Insufficient documentation

## 2023-06-24 DIAGNOSIS — R195 Other fecal abnormalities: Secondary | ICD-10-CM | POA: Diagnosis not present

## 2023-06-24 DIAGNOSIS — I5020 Unspecified systolic (congestive) heart failure: Secondary | ICD-10-CM | POA: Diagnosis not present

## 2023-06-24 DIAGNOSIS — I1A0 Resistant hypertension: Secondary | ICD-10-CM | POA: Diagnosis not present

## 2023-06-24 MED ORDER — METOPROLOL TARTRATE 50 MG PO TABS: 50.0000 mg | ORAL_TABLET | Freq: Two times a day (BID) | ORAL | 2 refills | Status: DC

## 2023-06-24 MED ORDER — POTASSIUM CHLORIDE CRYS ER 20 MEQ PO TBCR
20.0000 meq | EXTENDED_RELEASE_TABLET | Freq: Once | ORAL | 0 refills | Status: DC
Start: 2023-06-24 — End: 2023-07-05

## 2023-06-24 MED ORDER — TORSEMIDE 10 MG PO TABS
10.0000 mg | ORAL_TABLET | Freq: Every day | ORAL | 0 refills | Status: DC
Start: 2023-06-24 — End: 2023-07-05

## 2023-06-24 NOTE — Assessment & Plan Note (Signed)
I am concerned for colon cancer given weight loss over the last several months, abdominal pain with positive Cologuard.  Hemoglobin was a little low in May at 11.5.  Has not yet had colonoscopy though we have discussed the importance of this several times. -Advised to schedule colonoscopy ASAP -CBC with differential today

## 2023-06-24 NOTE — Patient Instructions (Signed)
It was great to see you! Thank you for allowing me to participate in your care!  I recommend that you always bring your medications to each appointment as this makes it easy to ensure you are on the correct medications and helps [place] not miss when refills are needed.  I sent in torsemide 10 mg. If this does not increase urinary frequency, increase to 20 mg daily. Continue potassium daily.   Return next week for follow up and check labs   Our plans for today:  Call to schedule apt with cardiology if needed, I  will look into scheduling with Dr. Duke Salvia: [facility] at [facility]     [address], [address],  [place], [place] [address]  [phone]  Call to schedule apt with GI for colonoscopy [facility] Address: [address], [place], [place] [address] Phone: [phone]  We are checking some labs today, I will call you if they are abnormal will send you a MyChart message or a letter if they are normal.  If you do not hear about your labs in the next 2 weeks please let us know.  Take care and seek immediate care sooner if you develop any concerns.   Dr. Erick Alley, DO [facility]

## 2023-06-24 NOTE — Assessment & Plan Note (Addendum)
Mild pitting edema BLEs but breathing comfortably on room air with clear lungs. -Rx torsemide 10 mg daily, can increase to 20 mg if needed -Rx potassium 20 mEq daily -BMP and mag today -Advised patient to return next week to monitor labs, thoroughly discussed the importance of this -New cardiology referral placed to Dr. Chilton Si per patient request

## 2023-06-25 LAB — CBC WITH DIFFERENTIAL/PLATELET
Basophils Absolute: 0.1 10*3/uL (ref 0.0–0.2)
Basos: 1 %
EOS (ABSOLUTE): 0.2 10*3/uL (ref 0.0–0.4)
Eos: 3 %
Hematocrit: 32.5 % — ABNORMAL LOW (ref 37.5–51.0)
Hemoglobin: 9.3 g/dL — ABNORMAL LOW (ref 13.0–17.7)
Immature Grans (Abs): 0 10*3/uL (ref 0.0–0.1)
Immature Granulocytes: 0 %
Lymphocytes Absolute: 1.6 10*3/uL (ref 0.7–3.1)
Lymphs: 25 %
MCH: 20.1 pg — ABNORMAL LOW (ref 26.6–33.0)
MCHC: 28.6 g/dL — ABNORMAL LOW (ref 31.5–35.7)
MCV: 70 fL — ABNORMAL LOW (ref 79–97)
Monocytes Absolute: 0.7 10*3/uL (ref 0.1–0.9)
Monocytes: 10 %
Neutrophils Absolute: 3.8 10*3/uL (ref 1.4–7.0)
Neutrophils: 61 %
Platelets: 198 10*3/uL (ref 150–450)
RBC: 4.62 x10E6/uL (ref 4.14–5.80)
RDW: 15.7 % — ABNORMAL HIGH (ref 11.6–15.4)
WBC: 6.3 10*3/uL (ref 3.4–10.8)

## 2023-06-25 LAB — BASIC METABOLIC PANEL
BUN/Creatinine Ratio: 26 — ABNORMAL HIGH (ref 10–24)
BUN: 25 mg/dL (ref 8–27)
CO2: 25 mmol/L (ref 20–29)
Calcium: 9.5 mg/dL (ref 8.6–10.2)
Chloride: 104 mmol/L (ref 96–106)
Creatinine, Ser: 0.98 mg/dL (ref 0.76–1.27)
Glucose: 101 mg/dL — ABNORMAL HIGH (ref 70–99)
Potassium: 3.3 mmol/L — ABNORMAL LOW (ref 3.5–5.2)
Sodium: 144 mmol/L (ref 134–144)
eGFR: 86 mL/min/{1.73_m2} (ref >59)

## 2023-06-25 LAB — MAGNESIUM: Magnesium: 2 mg/dL (ref 1.6–2.3)

## 2023-06-27 ENCOUNTER — Telehealth: Payer: Self-pay | Admitting: Student

## 2023-06-27 ENCOUNTER — Encounter: Payer: Self-pay | Admitting: Student

## 2023-06-27 NOTE — Telephone Encounter (Signed)
Attempted to call pt with lab results showing mild hypokalemia and worsened anemia- lmv to continue meds as discussed at last visit. Will send results via letter and discuss further at next visit

## 2023-06-29 ENCOUNTER — Other Ambulatory Visit: Payer: Self-pay | Admitting: Student

## 2023-06-29 DIAGNOSIS — E785 Hyperlipidemia, unspecified: Secondary | ICD-10-CM

## 2023-07-05 ENCOUNTER — Ambulatory Visit: Payer: Medicare PPO | Admitting: Student

## 2023-07-05 ENCOUNTER — Other Ambulatory Visit: Payer: Self-pay

## 2023-07-05 ENCOUNTER — Encounter: Payer: Self-pay | Admitting: Student

## 2023-07-05 VITALS — BP 139/83 | HR 85 | Ht 70.0 in | Wt 203.4 lb

## 2023-07-05 DIAGNOSIS — M79671 Pain in right foot: Secondary | ICD-10-CM

## 2023-07-05 DIAGNOSIS — E119 Type 2 diabetes mellitus without complications: Secondary | ICD-10-CM

## 2023-07-05 DIAGNOSIS — M79672 Pain in left foot: Secondary | ICD-10-CM

## 2023-07-05 DIAGNOSIS — L84 Corns and callosities: Secondary | ICD-10-CM

## 2023-07-05 DIAGNOSIS — I5020 Unspecified systolic (congestive) heart failure: Secondary | ICD-10-CM

## 2023-07-05 DIAGNOSIS — Z7984 Long term (current) use of oral hypoglycemic drugs: Secondary | ICD-10-CM | POA: Diagnosis not present

## 2023-07-05 DIAGNOSIS — M222X2 Patellofemoral disorders, left knee: Secondary | ICD-10-CM

## 2023-07-05 DIAGNOSIS — D509 Iron deficiency anemia, unspecified: Secondary | ICD-10-CM | POA: Diagnosis not present

## 2023-07-05 LAB — POCT GLYCOSYLATED HEMOGLOBIN (HGB A1C): HbA1c, POC (controlled diabetic range): 5.9 % (ref 0.0–7.0)

## 2023-07-05 MED ORDER — POTASSIUM CHLORIDE CRYS ER 20 MEQ PO TBCR: 40.0000 meq | EXTENDED_RELEASE_TABLET | Freq: Once | ORAL | 0 refills | Status: DC

## 2023-07-05 MED ORDER — TORSEMIDE 20 MG PO TABS: 20.0000 mg | ORAL_TABLET | Freq: Every day | ORAL | 0 refills | Status: DC

## 2023-07-05 MED ORDER — GLIPIZIDE ER 5 MG PO TB24: 5.0000 mg | ORAL_TABLET | Freq: Every day | ORAL | 0 refills | Status: DC

## 2023-07-05 MED ORDER — DAPAGLIFLOZIN PROPANEDIOL 5 MG PO TABS: 5.0000 mg | ORAL_TABLET | Freq: Every day | ORAL | 0 refills | Status: DC

## 2023-07-05 MED ORDER — GABAPENTIN 100 MG PO CAPS: 100.0000 mg | ORAL_CAPSULE | Freq: Every day | ORAL | 3 refills | Status: DC

## 2023-07-05 NOTE — Patient Instructions (Signed)
It was great to see you! Thank you for allowing me to participate in your care!  I recommend that you always bring your medications to each appointment as this makes it easy to ensure you are on the correct medications and helps [place] not miss when refills are needed.  Our plans for today:  -I recent referrals to cardiology and a referral to podiatry -Call gastroenterologist schedule appointment for colonoscopy, this is very important, cannot stress this enough -Increase torsemide to 20 mg daily and increase potassium to 40 mEq daily -Prescription for gabapentin sent to pharmacy for nerve pain of feet, take 1 at bedtime.  If you tolerate this well and want to increase to taking it twice a day you can -Return for follow-up visit this Friday, schedule appointment on your way out  We are checking some labs today, I will call you if they are abnormal will send you a MyChart message or a letter if they are normal.  If you do not hear about your labs in the next 2 weeks please let us know.  Take care and seek immediate care sooner if you develop any concerns.   Dr. Erick Alley, DO [facility]

## 2023-07-05 NOTE — Progress Notes (Cosign Needed Addendum)
    SUBJECTIVE:   CHIEF COMPLAINT / HPI:   Heart Failure Currently taking torsemide 10 mg daily  Is urinating frequently but not large amounts and not as much as when he was on Lasix 40 mg  Denies SOB but is very tired with exertion  Weight up 2 pounds since last week  Previously referred to cardiology twice but declined to go.  Today, states he would like another referral placed to Dr. Chilton Si (mother's cardiologist ) and plans to establish care  Bilateral foot pain Present for past couple months Feels like he is walking on hot coals, worse with walking.  Mostly along bottoms of feet but worse at ball of left foot.  T2DM Last A1c May 2024 of 5.9, 5.9 again today  Currently taking rybelsus, farxiga 5 mg and glipizide 10 mg daily  Checks fasting blood sugar 150-180 but has been as low as 100  Anemia Discussed drop in hemoglobin from 11.2> 9.3 over the past 5 months.  History of positive Cologuard.  No blood in stool.   PERTINENT  PMH / PSH: HTN, orthostatic hypotension, HFrEF, T2DM, anemia  OBJECTIVE:   BP 139/83   Pulse 85   Ht 5\' 10"  (1.778 m)   Wt 203 lb 6.4 oz (92.3 kg)   SpO2 100%   BMI 29.18 kg/m    General: NAD, pleasant Cardiac: RRR, no murmurs. Respiratory: CTAB, normal effort, No wheezes, rales or rhonchi Extremities: 2+ pitting edema LLE, 1+ pitting edema RLE, no erythema or pain with palpation.  Bilateral posterior tibial and dorsalis pedis pulses present, feet are warm, hammertoe deformities bilaterally, 2 corns on ball of left foot which are TTP Skin: warm and dry Neuro: alert, no obvious focal deficits Psych: Normal affect and mood  ASSESSMENT/PLAN:   HFrEF (heart failure with reduced ejection fraction) (HCC) Exam unchanged from last visit with pitting edema BLEs and breathing comfortably on room air. Can consider anemia to be contributing to BLE edema as well. Will increase torsemide to 20 mg as I do not think he is at his threshold for  adequate diuresis and was previously on lasix 40 mg -Increase torsemide to 20 mg daily -Increase potassium to 40 mEq daily -Continue metoprolol, lisinopril and Farxiga.  Can discuss increasing Farxiga to 10 mg at next visit -BMP and mag today -Return at the end of the week for repeat labs and follow-up with provider -Cardiology referral placed to Dr. Chilton Si per patient request  T2DM (type 2 diabetes mellitus) (HCC) A1c 5.9 and has had fasting sugars as low as 100.  Will decrease glipizide to 5 mg XL daily and continue Rybelsus and [place].  Patient advised to continue checking fasting sugars and return over the next month to review.  May be able to completely come off glipizide.  Anemia Hgb continuing to drop over the past several months.  Again, discussed the importance of scheduling colonoscopy.  Patient expressed understanding.  Iron supplement sent to pharmacy  Foot pain, bilateral Pain likely multifactorial.  Has corns on ball of left foot which are painful to touch.  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 -Podiatry referral placed -Gabapentin 100 mg at bedtime, can titrate up to 3 times daily if needed and tolerated well     Dr. Erick Alley, DO [place] [facility]

## 2023-07-06 ENCOUNTER — Telehealth: Payer: Self-pay | Admitting: Student

## 2023-07-06 DIAGNOSIS — G629 Polyneuropathy, unspecified: Secondary | ICD-10-CM | POA: Insufficient documentation

## 2023-07-06 DIAGNOSIS — M79671 Pain in right foot: Secondary | ICD-10-CM | POA: Insufficient documentation

## 2023-07-06 LAB — BASIC METABOLIC PANEL
BUN/Creatinine Ratio: 22 (ref 10–24)
BUN: 21 mg/dL (ref 8–27)
CO2: 25 mmol/L (ref 20–29)
Calcium: 9.6 mg/dL (ref 8.6–10.2)
Chloride: 106 mmol/L (ref 96–106)
Creatinine, Ser: 0.97 mg/dL (ref 0.76–1.27)
Glucose: 66 mg/dL — ABNORMAL LOW (ref 70–99)
Potassium: 3.4 mmol/L — ABNORMAL LOW (ref 3.5–5.2)
Sodium: 149 mmol/L — ABNORMAL HIGH (ref 134–144)
eGFR: 87 mL/min/{1.73_m2} (ref >59)

## 2023-07-06 LAB — MAGNESIUM: Magnesium: 1.8 mg/dL (ref 1.6–2.3)

## 2023-07-06 MED ORDER — IRON (FERROUS SULFATE) 325 (65 FE) MG PO TABS: 325.0000 mg | ORAL_TABLET | Freq: Every day | ORAL | 3 refills | Status: AC

## 2023-07-06 NOTE — Assessment & Plan Note (Addendum)
Exam unchanged from last visit with pitting edema BLEs and breathing comfortably on room air. Can consider anemia to be contributing to BLE edema as well. Will increase torsemide to 20 mg as I do not think he is at his threshold for adequate diuresis and was previously on lasix 40 mg -Increase torsemide to 20 mg daily -Increase potassium to 40 mEq daily -Continue metoprolol, lisinopril and Farxiga.  Can discuss increasing Farxiga to 10 mg at next visit -BMP and mag today -Return at the end of the week for repeat labs and follow-up with provider -Cardiology referral placed to Dr. Chilton Si per patient request

## 2023-07-06 NOTE — Assessment & Plan Note (Signed)
Pain likely multifactorial.  Has corns on ball of left foot which are painful to touch.  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 -Podiatry referral placed -Gabapentin 100 mg at bedtime, can titrate up to 3 times daily if needed and tolerated well

## 2023-07-06 NOTE — Assessment & Plan Note (Signed)
A1c 5.9 and has had fasting sugars as low as 100.  Will decrease glipizide to 5 mg XL daily and continue Rybelsus and [place].  Patient advised to continue checking fasting sugars and return over the next month to review.  May be able to completely come off glipizide.

## 2023-07-06 NOTE — Assessment & Plan Note (Addendum)
Hgb continuing to drop over the past several months.  Again, discussed the importance of scheduling colonoscopy.  Patient expressed understanding.  Iron supplement sent to pharmacy

## 2023-07-06 NOTE — Telephone Encounter (Signed)
Called pt to discuss lab results showing mild hypernatremia, mild hypokalemia.  Both torsemide and potassium were doubled yesterday.  Patient did not pick up, LVM that I recommend patient return tomorrow to be seen by provider and get labs. I did not leave detailed information regarding labs but advised patient he can call back to discuss further. If he is able to come in tomorrow I recommend he get a repeat BMP and magnesium.

## 2023-07-08 ENCOUNTER — Ambulatory Visit: Payer: Medicare PPO | Admitting: Student

## 2023-07-08 ENCOUNTER — Telehealth: Payer: Self-pay | Admitting: Student

## 2023-07-08 DIAGNOSIS — I5020 Unspecified systolic (congestive) heart failure: Secondary | ICD-10-CM

## 2023-07-08 NOTE — Telephone Encounter (Signed)
Called pt to check in as I saw he has not yet scheduled follow up apt after torsemide and potassium doubled.   Has not yet picked up new prescriptions/doubled torsemide or K or switched from Glipizide 10 mg to 5 mg XL. Did have apt w/ Dr. Marisue Humble today but canceled d/t his mother being in the hospital. States he did not take any torsemide yesterday and did not notice much of a difference in urination compared to when he was taking 10 mg.   I advised pt to schedule lab only visit at [facility] today if possible which will be quick so he can go back to hospital with his mother. I want to f/u on electrolytes and if stable would recommend continuing with plan to double both torsemide and potassium. Also advised pt stop glipizide 10 mg, pick up new 5 mg XL rx and check fasting sugars as discussed at last visit as glucose was 66 on recent BMP.   Pt states he will try to get labs done today and plans to schedule f/u apt with provider  at [facility] for early next week.

## 2023-07-12 ENCOUNTER — Telehealth: Payer: Self-pay

## 2023-07-12 NOTE — Telephone Encounter (Signed)
Patient calls nurse line requesting treatment for pink eye.   Reports that he woke up this morning with right eye swollen. States that there is redness and itching. No drainage at this time. Cleaned area with water and used visine eye drops.   Reports continued irritation and redness.   Does not wear glasses/contacts. Denies double vision or floaters. Reports slight blurriness to vision last night.   Offered appointment for today. Patient declines and asks that I send message to PCP to see if treatment can be sent in without appointment.   Please advise.   Veronda Prude, RN

## 2023-07-13 ENCOUNTER — Ambulatory Visit: Payer: Medicare PPO | Admitting: Family Medicine

## 2023-07-13 VITALS — BP 137/73 | HR 86 | Temp 98.1°F | Ht 70.0 in | Wt 203.4 lb

## 2023-07-13 DIAGNOSIS — Z7984 Long term (current) use of oral hypoglycemic drugs: Secondary | ICD-10-CM

## 2023-07-13 DIAGNOSIS — E119 Type 2 diabetes mellitus without complications: Secondary | ICD-10-CM

## 2023-07-13 DIAGNOSIS — H5789 Other specified disorders of eye and adnexa: Secondary | ICD-10-CM | POA: Diagnosis not present

## 2023-07-13 NOTE — Patient Instructions (Addendum)
Your exam did not show any significant foreign bodies, infectious 'pink eye', or cuts on your eye.  Please use saline eye drops and continue to keep your eye clean with warm water. You can also use warm washcloths over the eye and massage the eye regularly to help.   If your eyes are itchy, you can also try over the counter allergy medicine or allergy eye drops  I have placed a referral for you to see an eye doctor to have an exam done.  You should get a call about this within the next couple of weeks

## 2023-07-13 NOTE — Progress Notes (Signed)
    SUBJECTIVE:   CHIEF COMPLAINT / HPI:   Concern for pinkeye On 11/5, woke up with swollen right eye with redness and itching and no drainage.  Cleaned with water and used Visine eyedrops but had persistent irritation and redness  Does not wear glasses or contacts  Today, he woke up with some swelling again and noticed a little bit of clear drainage.  Denies significant pus or crusting.  His left eye has been a little bit itchy but overall normal.  He denies any recent fevers, cough/cold but does endorse some sinus congestion over the last week.  PERTINENT  PMH / PSH: T2DM  OBJECTIVE:   BP 137/73   Pulse 86   Temp 98.1 F (36.7 C)   Ht 5\' 10"  (1.778 m)   Wt 203 lb 6.4 oz (92.3 kg)   SpO2 100%   BMI 29.18 kg/m   General: NAD, pleasant, able to participate in exam Eyes: No significant gross ecchymoses or swelling.  PERRLA, EOMI.  Right sclera with evidence of subconjunctival hemorrhage laterally.  Otherwise clear with no evidence of foreign body.  No drainage or crusting noted aside from clear tears.  Fluorescein dye exam completed with Woods lamp, no evidence of corneal abrasion noted. Respiratory: No respiratory distress Skin: warm and dry, no rashes noted Psych: Normal affect and mood  ASSESSMENT/PLAN:   Assessment & Plan Irritation of right eye No evidence of acute bacterial conjunctivitis, low suspicion for viral etiology, given subconjunctival hemorrhage he was evaluated with a fluorescein dye exam for abrasions but this was negative.  Discussed routine care with saline eyedrops and warm compresses, suspect that this will be a self-limited issue.  May also try OTC allergy medications or eyedrops for any itching.  Patient is also due for an ophthalmology exam given his diabetes, placed referral to ophthalmology for this and if his symptoms continue may also need closer follow-up with them.   Vonna Drafts, MD [facility]

## 2023-07-13 NOTE — Telephone Encounter (Signed)
Patient seen by Dr. Barb Merino this morning.   Veronda Prude, RN

## 2023-07-14 ENCOUNTER — Telehealth: Payer: Self-pay | Admitting: Student

## 2023-07-14 NOTE — Telephone Encounter (Signed)
Called pt, LVM that he still needs to come in to lab only visit ASAP. See previous telephone notes for details.

## 2023-07-15 ENCOUNTER — Other Ambulatory Visit: Payer: Self-pay | Admitting: Student

## 2023-07-15 ENCOUNTER — Encounter: Payer: Self-pay | Admitting: Family Medicine

## 2023-07-15 DIAGNOSIS — I5020 Unspecified systolic (congestive) heart failure: Secondary | ICD-10-CM

## 2023-07-15 DIAGNOSIS — E119 Type 2 diabetes mellitus without complications: Secondary | ICD-10-CM

## 2023-07-21 ENCOUNTER — Telehealth: Payer: Self-pay

## 2023-07-21 DIAGNOSIS — I10 Essential (primary) hypertension: Secondary | ICD-10-CM

## 2023-07-21 NOTE — Telephone Encounter (Signed)
Patient LVM on nurse line requesting a refill on Amlodipine.   He reports he is not sure if he is on this medication anymore.  Will forward to PCP.

## 2023-07-22 NOTE — Telephone Encounter (Signed)
Called pt to discuss medication - Ran out of amlodipine about a week ago.   Has not been taking the torsemide because he has been urinating frequently. But he still taking the potassium. Denies feeling SOB, states he is feeling well currently.   Has been taking gabapentin 100 mg BID for likely neuropathy but it hasn't helped much.   Plan: Apt make with Dr. Laroy Apple 07/26/2023 to assess need for diuretics and check BMP and mag in setting of newly diagnosed heart failure. PT advised if he has been feeling well off of the torsemide, it is okay to continue to hold until this apt. Also advised to stop the potassium while not taking diuretics.  I did not refill his amlodipine. If blood pressure can tolerate, I would prefer to get him back on spironolactone instead. He is already on bblocker, SGLT2, ACE-I.   For neuropathy, I suggested he increase gabapentin to 100 mg TID. Will likely need further titration and will discuss other medication options at f/u visit scheduled for 08/12/23 with me.

## 2023-07-25 NOTE — Progress Notes (Unsigned)
    SUBJECTIVE:   CHIEF COMPLAINT / HPI: Heart failure  Per Phone Note: Called pt to discuss medication - Ran out of amlodipine about a week ago.  Has not been taking the torsemide because he has been urinating frequently. But he still taking the potassium. Denies feeling SOB, states he is feeling well currently.  Has been taking gabapentin 100 mg BID for likely neuropathy but it hasn't helped much. Apt make with Dr. Laroy Apple 07/26/2023 to assess need for diuretics and check BMP and mag in setting of newly diagnosed heart failure. PT advised if he has been feeling well off of the torsemide, it is okay to continue to hold until this apt. Also advised to stop the potassium while not taking diuretics.  I did not refill his amlodipine. If blood pressure can tolerate, I would prefer to get him back on spironolactone instead. He is already on bblocker, SGLT2, ACE-I.   Today states he has been off their heart failure medications, including Torsemide, for about a month due to the inconvenience of frequent urination while caring for their mother in a rehab facility. The patient reports persistent swelling in their legs, which has not improved despite medication and elevating their feet while sleeping. They experience shortness of breath after walking long distances, which they attribute to the distance and their current weight. They also report occasional chest pain, particularly after exertion, which tends to resolve with rest. Denies any current chest pain. They have stopped taking amlodipine and potassium as per their previous doctor's advice.  PERTINENT  PMH / PSH: Resistant hypertension, T2DM OBJECTIVE:   BP 139/77   Pulse 99   Ht 5\' 10"  (1.778 m)   Wt 206 lb (93.4 kg)   SpO2 100%   BMI 29.56 kg/m   General: NAD, awake, alert, responsive to questions Head: Normocephalic atraumatic CV: Regular rate and rhythm no murmurs rubs or gallops, no JVD Respiratory: Clear to ausculation bilaterally, no  wheezes rales or crackles, chest rises symmetrically,  no increased work of breathing on room air Abdomen: Soft, non-tender, non-distended, normoactive bowel sounds  Extremities: Moves upper and lower extremities freely, 2+ pitting edema to knees bilaterally  ASSESSMENT/PLAN:   Assessment & Plan HFrEF (heart failure with reduced ejection fraction) (HCC) Patient has been off Torsemide due to frequent urination and difficulty accessing a bathroom while caring for his mother. Noted persistent lower extremity edema and shortness of breath with exertion. BP not at goal today. -Discontinue Amlodipine -Start Spironolactone 25 mg daily -Continue metoprolol and lisinopril -Stop potassium supplement -Advise patient to take Torsemide if experiencing rapid weight gain or increased shortness of breath -Check BMP -Follow-up in two weeks to recheck blood pressure and potassium level follow up     Levin Erp, MD [facility]

## 2023-07-26 ENCOUNTER — Encounter: Payer: Self-pay | Admitting: Student

## 2023-07-26 ENCOUNTER — Ambulatory Visit (INDEPENDENT_AMBULATORY_CARE_PROVIDER_SITE_OTHER): Payer: Self-pay | Admitting: Student

## 2023-07-26 VITALS — BP 139/77 | HR 99 | Ht 70.0 in | Wt 206.0 lb

## 2023-07-26 DIAGNOSIS — I5020 Unspecified systolic (congestive) heart failure: Secondary | ICD-10-CM | POA: Diagnosis not present

## 2023-07-26 DIAGNOSIS — Z23 Encounter for immunization: Secondary | ICD-10-CM

## 2023-07-26 MED ORDER — SPIRONOLACTONE 25 MG PO TABS: 25.0000 mg | ORAL_TABLET | Freq: Every day | ORAL | 0 refills | Status: DC

## 2023-07-26 NOTE — Patient Instructions (Signed)
It was great to see you! Thank you for allowing me to participate in your care!   Our plans for today:  - We are starting spironolactone today ---discontinued amlodipine - I am sending in 10 mg torsemide to take if you have shortness of breath, rapid weight gain - Do not take potassium pills - 2 week follow up with PCP for blood pressure and lab recheck  Take care and seek immediate care sooner if you develop any concerns.  Levin Erp, MD

## 2023-07-26 NOTE — Assessment & Plan Note (Signed)
Patient has been off Torsemide due to frequent urination and difficulty accessing a bathroom while caring for his mother. Noted persistent lower extremity edema and shortness of breath with exertion. BP not at goal today. -Discontinue Amlodipine -Start Spironolactone 25 mg daily -Continue metoprolol and lisinopril -Stop potassium supplement -Advise patient to take Torsemide if experiencing rapid weight gain or increased shortness of breath -Check BMP -Follow-up in two weeks to recheck blood pressure and potassium level follow up

## 2023-07-27 ENCOUNTER — Ambulatory Visit: Payer: Medicare PPO | Admitting: Cardiovascular Disease

## 2023-07-27 ENCOUNTER — Encounter: Payer: Self-pay | Admitting: Student

## 2023-07-27 LAB — BASIC METABOLIC PANEL
BUN/Creatinine Ratio: 14 (ref 10–24)
BUN: 15 mg/dL (ref 8–27)
CO2: 23 mmol/L (ref 20–29)
Calcium: 9.1 mg/dL (ref 8.6–10.2)
Chloride: 104 mmol/L (ref 96–106)
Creatinine, Ser: 1.05 mg/dL (ref 0.76–1.27)
Glucose: 201 mg/dL — ABNORMAL HIGH (ref 70–99)
Potassium: 3.6 mmol/L (ref 3.5–5.2)
Sodium: 143 mmol/L (ref 134–144)
eGFR: 78 mL/min/{1.73_m2} (ref >59)

## 2023-07-29 ENCOUNTER — Other Ambulatory Visit: Payer: Self-pay | Admitting: Student

## 2023-07-29 DIAGNOSIS — E119 Type 2 diabetes mellitus without complications: Secondary | ICD-10-CM

## 2023-08-10 ENCOUNTER — Ambulatory Visit: Payer: Medicare PPO | Admitting: Student

## 2023-08-12 ENCOUNTER — Ambulatory Visit: Payer: Self-pay | Admitting: Student

## 2023-08-14 ENCOUNTER — Other Ambulatory Visit: Payer: Self-pay | Admitting: Student

## 2023-08-14 DIAGNOSIS — E119 Type 2 diabetes mellitus without complications: Secondary | ICD-10-CM

## 2023-08-14 DIAGNOSIS — I5020 Unspecified systolic (congestive) heart failure: Secondary | ICD-10-CM

## 2023-08-15 ENCOUNTER — Encounter: Payer: Self-pay | Admitting: Student

## 2023-08-15 ENCOUNTER — Telehealth: Payer: Self-pay | Admitting: Student

## 2023-08-15 ENCOUNTER — Telehealth: Payer: Self-pay

## 2023-08-15 DIAGNOSIS — M79672 Pain in left foot: Secondary | ICD-10-CM

## 2023-08-15 MED ORDER — GABAPENTIN 100 MG PO CAPS: 100.0000 mg | ORAL_CAPSULE | Freq: Two times a day (BID) | ORAL | 0 refills | Status: DC

## 2023-08-15 MED ORDER — EMPAGLIFLOZIN 10 MG PO TABS: 10.0000 mg | ORAL_TABLET | Freq: Every day | ORAL | 0 refills | Status: DC

## 2023-08-15 NOTE — Telephone Encounter (Addendum)
Called pt to discuss medications.   Insurance will no longer cover farxiga, will switch to jardiance.  Rx for gabapentin 100 mg BID as BID is helping significantly.  Advised coming in ASAP, pt prefers to wait and come for my next opening on 12/26 so he can plan to have someone watch his mother.

## 2023-08-15 NOTE — Telephone Encounter (Signed)
Patient calls nurse line requesting a refill on Gabapentin.   He reports he has been taking (2) capsules BID. He reports during his phone conversation with PCP she told him he could do this vs (1) capsule TID.   He reports taking (2) BID has been helpful and reports he can really tell a difference at night.   He would like the new script to reflect (2) capsules BID.  Will forward to PCP.

## 2023-08-31 NOTE — Progress Notes (Signed)
    SUBJECTIVE:   CHIEF COMPLAINT / HPI:   HErEF and HTN Currently taking Jardiance 10 mg daily, lisinopril 40 mg daily, metoprolol 50 mg BID. He is unsure if he is taking spironolactone 25 mg daily, not sure if he picked it up after it was prescribed at last visit. Not taking torsemide.  Previously referred to cardiology x 3 but referrals have been closed due to patient noncompliant with setting up appointments  T2DM Checking blood sugar a couplex/week. Fasting: 130-160's Taking Jardiance 10 mg daily, glipizide 5 mg daily, Rybelsus 3 mg daily Declines colonoscopy   Lateral foot pain Taking gabapentin 100 mg 1-2x/day which he states he tolerates it well however does not do anything for his pain.  States his feet feel like they are burning when he walks around during the day and at bedtime.   PERTINENT  PMH / PSH: T2DM, heart failure, IDA, neuropathy  OBJECTIVE:   BP (!) 159/90   Pulse 96   Ht 5\' 10"  (1.778 m)   Wt 200 lb (90.7 kg)   SpO2 100%   BMI 28.70 kg/m    General: NAD, pleasant, able to participate in exam Cardiac: RRR, no murmurs.  No JVD Respiratory: CTAB, normal effort, No wheezes, rales or rhonchi Extremities: Trace edema BLEs Neuro: alert, no obvious focal deficits Psych: Normal affect and mood  ASSESSMENT/PLAN:   HFrEF (heart failure with reduced ejection fraction) (HCC) Appears euvolemic on exam.   -Continue Jardiance 10 mg daily, lisinopril 40 mg daily, metoprolol 50 mg twice daily.   -Should be taking spironolactone 25 mg daily, will call patient tomorrow when he is at home to see if he had picked it up after last visit -Torsemide as needed for weight gain, shortness of breath, swelling -Advised patient to call Dr. Thomasene Lot office to see if he can get an appointment, I can replace referral if needed -Follow-up in two weeks to go over medications, should bring them with -BMP today  Foot pain, bilateral Likely due to diabetic neuropathy as  pain is burning in nature -Discussed how to safely uptitrate gabapentin at home -Can try OTC capsaicin cream -CTM  T2DM (type 2 diabetes mellitus) (HCC) Fasting sugars no longer under 100 since glipizide was decreased. -Continue Jardiance 10 mg daily, glipizide 5 mg daily, Rybelsus 3 mg daily -Return in about a month for next A1c.  Resistant hypertension Uncontrolled but patient is unsure if he is taking spironolactone or not -Continue regimen as in HPI, if patient is not taking spironolactone and does not have at home, we will resend Rx  -Return in 2 weeks for BP check, may need another BMP if restarting spironolactone  Anemia Patient is taking iron supplement as prescribed.  Adamantly states he will not call to schedule colonoscopy although he seems to understand the risks including delaying diagnosis of colon cancer. -Continue iron supplement -Continue to encourage colonoscopy -Rechecks iron studies in about a month     Dr. Erick Alley, DO [facility]

## 2023-09-01 ENCOUNTER — Ambulatory Visit (INDEPENDENT_AMBULATORY_CARE_PROVIDER_SITE_OTHER): Payer: Medicare PPO | Admitting: Student

## 2023-09-01 ENCOUNTER — Encounter: Payer: Self-pay | Admitting: Student

## 2023-09-01 VITALS — BP 159/90 | HR 96 | Ht 70.0 in | Wt 200.0 lb

## 2023-09-01 DIAGNOSIS — D509 Iron deficiency anemia, unspecified: Secondary | ICD-10-CM | POA: Diagnosis not present

## 2023-09-01 DIAGNOSIS — M79671 Pain in right foot: Secondary | ICD-10-CM | POA: Diagnosis not present

## 2023-09-01 DIAGNOSIS — E119 Type 2 diabetes mellitus without complications: Secondary | ICD-10-CM | POA: Diagnosis not present

## 2023-09-01 DIAGNOSIS — M79672 Pain in left foot: Secondary | ICD-10-CM

## 2023-09-01 DIAGNOSIS — I5020 Unspecified systolic (congestive) heart failure: Secondary | ICD-10-CM | POA: Diagnosis not present

## 2023-09-01 DIAGNOSIS — I1A0 Resistant hypertension: Secondary | ICD-10-CM

## 2023-09-01 MED ORDER — GABAPENTIN 100 MG PO CAPS: 100.0000 mg | ORAL_CAPSULE | Freq: Three times a day (TID) | ORAL | 3 refills | Status: DC

## 2023-09-01 NOTE — Patient Instructions (Addendum)
It was great to see you! Thank you for allowing me to participate in your care!  I recommend that you always bring your medications to each appointment as this makes it easy to ensure you are on the correct medications and helps [place] not miss when refills are needed.  Our plans for today:  - Call Chilton Si, MD to schedule apt [facility] [phone] -can increase gabapentin as tolerated: increase to 100 mg three times a day. Then, further increase by 100 mg daily (first at bedtime, then lunch time, then morning if needed). Do not increase more than 300 mg three times a day.  - Can try over the counter capsaicin cream for foot pain  - I will call you tomorrow to see if you are taking the spironolactone. You should be taking spironolactone, lisinopril and metoprolol for blood pressure.  - Return in 1 month for A1c check  - Return in 2 weeks for blood pressure check,  check BP at home daily and bring readings to next appointment  We are checking some labs today, I will call you if they are abnormal will send you a MyChart message or a letter if they are normal.  If you do not hear about your labs in the next 2 weeks please let us know.  Take care and seek immediate care sooner if you develop any concerns.   Dr. Erick Alley, DO [facility]

## 2023-09-02 ENCOUNTER — Telehealth: Payer: Self-pay

## 2023-09-02 ENCOUNTER — Telehealth: Payer: Self-pay | Admitting: Student

## 2023-09-02 ENCOUNTER — Encounter: Payer: Self-pay | Admitting: Student

## 2023-09-02 LAB — BASIC METABOLIC PANEL
BUN/Creatinine Ratio: 18 (ref 10–24)
BUN: 18 mg/dL (ref 8–27)
CO2: 20 mmol/L (ref 20–29)
Calcium: 9.7 mg/dL (ref 8.6–10.2)
Chloride: 102 mmol/L (ref 96–106)
Creatinine, Ser: 1.01 mg/dL (ref 0.76–1.27)
Glucose: 100 mg/dL — ABNORMAL HIGH (ref 70–99)
Potassium: 4.4 mmol/L (ref 3.5–5.2)
Sodium: 138 mmol/L (ref 134–144)
eGFR: 82 mL/min/{1.73_m2} (ref >59)

## 2023-09-02 MED ORDER — TORSEMIDE 20 MG PO TABS: 20.0000 mg | ORAL_TABLET | Freq: Every day | ORAL | Status: DC | PRN

## 2023-09-02 NOTE — Telephone Encounter (Signed)
Called pt who reports he has been taking the spironolactone in addition to metoprolol and lisinopril but now states he had not taken his BP meds prior to apt yesterday which explains why BP was elevated. States his BP is 138/80 this morning at home and he feels well.  Discussed BMP shows normal potassium after being on spironolactone since November. Given he feels well, BP is at goal, patient prefers to cancel appointment scheduled for 07/15/2024 for BP check which I feel is reasonable at this time.  Will plan to follow-up with me on 10/10/2023.  Patient encouraged to bring medications and BP readings to this appointment.

## 2023-09-02 NOTE — Assessment & Plan Note (Signed)
Fasting sugars no longer under 100 since glipizide was decreased. -Continue Jardiance 10 mg daily, glipizide 5 mg daily, Rybelsus 3 mg daily -Return in about a month for next A1c.

## 2023-09-02 NOTE — Assessment & Plan Note (Signed)
Patient is taking iron supplement as prescribed.  Adamantly states he will not call to schedule colonoscopy although he seems to understand the risks including delaying diagnosis of colon cancer. -Continue iron supplement -Continue to encourage colonoscopy -Rechecks iron studies in about a month

## 2023-09-02 NOTE — Telephone Encounter (Signed)
Patient calls nurse line asking that I send message to Dr. Yetta Barre. He reports that he has been taking spironolactone 25 mg.   He also wanted to let provider know that he has scheduled an appointment with cardiologist, however, this will not be until 12/30/23. He has been placed on wait/cancellation list.   Veronda Prude, RN

## 2023-09-02 NOTE — Assessment & Plan Note (Signed)
Likely due to diabetic neuropathy as pain is burning in nature -Discussed how to safely uptitrate gabapentin at home -Can try OTC capsaicin cream -CTM

## 2023-09-02 NOTE — Assessment & Plan Note (Signed)
Uncontrolled but patient is unsure if he is taking spironolactone or not -Continue regimen as in HPI, if patient is not taking spironolactone and does not have at home, we will resend Rx  -Return in 2 weeks for BP check, may need another BMP if restarting spironolactone

## 2023-09-02 NOTE — Addendum Note (Signed)
Addended by: Howard Pouch on: 09/02/2023 12:02 PM   Modules accepted: Orders

## 2023-09-02 NOTE — Assessment & Plan Note (Addendum)
Appears euvolemic on exam.   -Continue Jardiance 10 mg daily, lisinopril 40 mg daily, metoprolol 50 mg twice daily.   -Should be taking spironolactone 25 mg daily, will call patient tomorrow when he is at home to see if he had picked it up after last visit -Torsemide as needed for weight gain, shortness of breath, swelling -Advised patient to call Dr. Thomasene Lot office to see if he can get an appointment, I can replace referral if needed -Follow-up in two weeks to go over medications, should bring them with -BMP today

## 2023-09-15 ENCOUNTER — Ambulatory Visit: Payer: Medicare PPO | Admitting: Student

## 2023-09-19 ENCOUNTER — Other Ambulatory Visit: Payer: Self-pay | Admitting: Student

## 2023-09-19 DIAGNOSIS — I10 Essential (primary) hypertension: Secondary | ICD-10-CM

## 2023-09-21 ENCOUNTER — Other Ambulatory Visit: Payer: Self-pay | Admitting: Student

## 2023-10-10 ENCOUNTER — Ambulatory Visit (INDEPENDENT_AMBULATORY_CARE_PROVIDER_SITE_OTHER): Payer: Medicare PPO | Admitting: Student

## 2023-10-10 ENCOUNTER — Encounter: Payer: Self-pay | Admitting: Student

## 2023-10-10 VITALS — BP 165/85 | HR 83 | Ht 70.0 in | Wt 198.8 lb

## 2023-10-10 DIAGNOSIS — D509 Iron deficiency anemia, unspecified: Secondary | ICD-10-CM

## 2023-10-10 DIAGNOSIS — I1A0 Resistant hypertension: Secondary | ICD-10-CM | POA: Diagnosis not present

## 2023-10-10 DIAGNOSIS — E119 Type 2 diabetes mellitus without complications: Secondary | ICD-10-CM

## 2023-10-10 DIAGNOSIS — G629 Polyneuropathy, unspecified: Secondary | ICD-10-CM | POA: Diagnosis not present

## 2023-10-10 DIAGNOSIS — I5020 Unspecified systolic (congestive) heart failure: Secondary | ICD-10-CM | POA: Diagnosis not present

## 2023-10-10 LAB — POCT GLYCOSYLATED HEMOGLOBIN (HGB A1C): HbA1c, POC (controlled diabetic range): 5.4 % (ref 0.0–7.0)

## 2023-10-10 MED ORDER — AMLODIPINE BESYLATE 5 MG PO TABS: 5.0000 mg | ORAL_TABLET | Freq: Every day | ORAL | 0 refills | Status: DC

## 2023-10-10 NOTE — Progress Notes (Unsigned)
    SUBJECTIVE:   CHIEF COMPLAINT / HPI:   IDA Currently taking iron supplement.   T2DM and diabetic neuropathy Currently taking Jardiance 10 mg daily, Rybelsus 3 mg daily, and glipizide 5 mg daily Checking fasting sugars daily- fasting 108-131 Currently using taking gabapentin 200 mg TID. He is unsure if it helps. Tried taking 300 mg at bed time but made it hard to get up to pee at night.   HTN BP at home: ranging 140's-150's/80's-90 Taking lisinopril 40 mg, metop 50 mg BID, spironolactone 25 mg daily  HFrEF Currently taking lisinopril 40 mg daily, metoprolol 50 mg twice daily, spironolactone 25 mg daily Jardiance 10 mg daily and has torsemide 20 mg tablets at home to use as needed for shortness of breath, weight gain and swelling.     PERTINENT  PMH / PSH: ***  OBJECTIVE:   BP (!) 169/93   Pulse 86   Ht 5\' 10"  (1.778 m)   Wt 198 lb 12.8 oz (90.2 kg)   SpO2 99%   BMI 28.52 kg/m    General: NAD, pleasant, able to participate in exam Cardiac: RRR, no murmurs. Respiratory: CTAB, normal effort, No wheezes, rales or rhonchi Abdomen: Bowel sounds present, nontender, nondistended, no hepatosplenomegaly. Extremities: no edema or cyanosis. Skin: warm and dry, no rashes noted Neuro: alert, no obvious focal deficits Psych: Normal affect and mood  ASSESSMENT/PLAN:   T2DM (type 2 diabetes mellitus) (HCC) For diabetes: A1c 5.4 today Stop taking glipizide 5 mg daily.   Continue taking Jardiance 10 mg daily and Rybelsus 3 mg daily Return in 3 months for next A1c check  Euvolemic on exam. Continue current medications as in HPI. F/u w/ cards in April.   Dr. Erick Alley, DO [facility]    {    This will disappear when note is signed, click to select method of visit    :1}

## 2023-10-10 NOTE — Assessment & Plan Note (Signed)
For diabetes: A1c 5.4 today Stop taking glipizide 5 mg daily.   Continue taking Jardiance 10 mg daily and Rybelsus 3 mg daily Return in 3 months for next A1c check

## 2023-10-10 NOTE — Patient Instructions (Addendum)
It was great to see you! Thank you for allowing me to participate in your care!  I recommend that you always bring your medications to each appointment as this makes it easy to ensure you are on the correct medications and helps [place] not miss when refills are needed.  Our plans for today:  - Call to schedule apt with podiatry: [facility] work que. [address] [place] [phone] - For diabetes: A1c 5.4 today Stop taking glipizide 5 mg daily.   Continue taking Jardiance 10 mg daily and Rybelsus 3 mg daily Return in 3 months for next A1c check -For hypertension: Continue current medications PLUS amlodipine which has been sent to the pharmacy   We are checking some labs today, I will call you if they are abnormal will send you a MyChart message or a letter if they are normal.  If you do not hear about your labs in the next 2 weeks please let us know.  Take care and seek immediate care sooner if you develop any concerns.   Dr. Erick Alley, DO [facility]

## 2023-10-11 ENCOUNTER — Telehealth: Payer: Self-pay | Admitting: Student

## 2023-10-11 LAB — CBC
Hematocrit: 40.2 % (ref 37.5–51.0)
Hemoglobin: 12.2 g/dL — ABNORMAL LOW (ref 13.0–17.7)
MCH: 23.8 pg — ABNORMAL LOW (ref 26.6–33.0)
MCHC: 30.3 g/dL — ABNORMAL LOW (ref 31.5–35.7)
MCV: 78 fL — ABNORMAL LOW (ref 79–97)
Platelets: 209 10*3/uL (ref 150–450)
RBC: 5.13 x10E6/uL (ref 4.14–5.80)
RDW: 19.7 % — ABNORMAL HIGH (ref 11.6–15.4)
WBC: 7.2 10*3/uL (ref 3.4–10.8)

## 2023-10-11 LAB — IRON,TIBC AND FERRITIN PANEL
Ferritin: 23 ng/mL — ABNORMAL LOW (ref 30–400)
Iron Saturation: 68 % — ABNORMAL HIGH (ref 15–55)
Iron: 304 ug/dL (ref 38–169)
Total Iron Binding Capacity: 447 ug/dL (ref 250–450)
UIBC: 143 ug/dL (ref 111–343)

## 2023-10-11 LAB — BASIC METABOLIC PANEL
BUN/Creatinine Ratio: 18 (ref 10–24)
BUN: 22 mg/dL (ref 8–27)
CO2: 21 mmol/L (ref 20–29)
Calcium: 9.9 mg/dL (ref 8.6–10.2)
Chloride: 105 mmol/L (ref 96–106)
Creatinine, Ser: 1.24 mg/dL (ref 0.76–1.27)
Glucose: 75 mg/dL (ref 70–99)
Potassium: 4.2 mmol/L (ref 3.5–5.2)
Sodium: 141 mmol/L (ref 134–144)
eGFR: 64 mL/min/{1.73_m2} (ref >59)

## 2023-10-11 NOTE — Assessment & Plan Note (Signed)
Continue gabapentin Patient advised to use the throw away gloves before applying Capsaicin cream

## 2023-10-11 NOTE — Telephone Encounter (Signed)
 Called patient to discuss anemia labs showing that since he has been taking an iron  supplement, his hemoglobin has improved from 9.3 to 12.2 however his anemia panel is perplexing with high iron  at 304, high iron  saturation at 68 but a low ferritin at 23.    I called on call heme onc to discuss labs who reassured me that high iron  at 304 is okay since pt is taking supplement and that ferritin is low because he just hasn't stated storing it yet. Recommendation is to continue iron  supplement.   Pt advised to continue daily iron  and again advised to get colonoscopy which he states he will think about.

## 2023-10-11 NOTE — Assessment & Plan Note (Signed)
-  CBC, iron, TIBC and ferritin today -Continue iron supplement - encouraged patient to get colonoscopy which he continues to adamantly refuse and understands the risks of undiagnosed colon cancer

## 2023-10-11 NOTE — Assessment & Plan Note (Signed)
 Uncontrolled based on home readings and in the office today.  Does have history of significant orthostatic hypotension. Will add low-dose amlodipine  5 mg to current regimen as in HPI.  Patient to continue checking blood pressure at home and return in 2 to 4 weeks for recheck

## 2023-10-11 NOTE — Assessment & Plan Note (Addendum)
 Euvolemic on exam.  -Advised to continue current medications as in HPI.  Because he is not able to specifically verify which medications he is taking, he is asked to go home and look over his medication list and compare them to the medications he has to verify and to bring his medications with him to the next visit. -BMP today considering patient is unsure which medications he is on -F/u w/ cards in April.

## 2023-10-13 ENCOUNTER — Other Ambulatory Visit: Payer: Self-pay

## 2023-10-19 ENCOUNTER — Other Ambulatory Visit: Payer: Self-pay | Admitting: Student

## 2023-10-20 ENCOUNTER — Telehealth: Payer: Self-pay

## 2023-10-20 NOTE — Telephone Encounter (Signed)
Patient LVM on nurse line regarding not being able to pick up amlodipine prescription.   Called [facility] multiple times to see if I can determine what the issue is.   I ended up leaving VM on their provider line requesting returned call, as I have not been able to speak with someone directly.   Called patient and advised of update.   Veronda Prude, RN

## 2023-10-24 ENCOUNTER — Other Ambulatory Visit: Payer: Self-pay | Admitting: Student

## 2023-11-01 ENCOUNTER — Encounter: Payer: Self-pay | Admitting: Student

## 2023-11-01 ENCOUNTER — Ambulatory Visit (INDEPENDENT_AMBULATORY_CARE_PROVIDER_SITE_OTHER): Payer: Medicare PPO | Admitting: Student

## 2023-11-01 ENCOUNTER — Other Ambulatory Visit: Payer: Self-pay

## 2023-11-01 VITALS — BP 152/90 | HR 79 | Ht 70.0 in | Wt 197.4 lb

## 2023-11-01 DIAGNOSIS — M79671 Pain in right foot: Secondary | ICD-10-CM | POA: Diagnosis not present

## 2023-11-01 DIAGNOSIS — R079 Chest pain, unspecified: Secondary | ICD-10-CM | POA: Diagnosis not present

## 2023-11-01 DIAGNOSIS — E119 Type 2 diabetes mellitus without complications: Secondary | ICD-10-CM | POA: Diagnosis not present

## 2023-11-01 DIAGNOSIS — I2089 Other forms of angina pectoris: Secondary | ICD-10-CM | POA: Diagnosis not present

## 2023-11-01 DIAGNOSIS — I1A0 Resistant hypertension: Secondary | ICD-10-CM | POA: Diagnosis not present

## 2023-11-01 DIAGNOSIS — G629 Polyneuropathy, unspecified: Secondary | ICD-10-CM

## 2023-11-01 DIAGNOSIS — I5020 Unspecified systolic (congestive) heart failure: Secondary | ICD-10-CM

## 2023-11-01 DIAGNOSIS — M79672 Pain in left foot: Secondary | ICD-10-CM | POA: Diagnosis not present

## 2023-11-01 MED ORDER — NITROGLYCERIN 0.4 MG SL SUBL: 0.4000 mg | SUBLINGUAL_TABLET | SUBLINGUAL | 12 refills | Status: AC | PRN

## 2023-11-01 MED ORDER — RYBELSUS 3 MG PO TABS: ORAL_TABLET | ORAL | 0 refills | Status: DC

## 2023-11-01 MED ORDER — POTASSIUM CHLORIDE CRYS ER 20 MEQ PO TBCR: 20.0000 meq | EXTENDED_RELEASE_TABLET | ORAL | Status: AC | PRN

## 2023-11-01 MED ORDER — AMLODIPINE BESYLATE 5 MG PO TABS: 5.0000 mg | ORAL_TABLET | Freq: Every day | ORAL | 0 refills | Status: DC

## 2023-11-01 NOTE — Patient Instructions (Addendum)
 It was great to see you! Thank you for allowing me to participate in your care!  I recommend that you always bring your medications to each appointment as this makes it easy to ensure you are on the correct medications and helps [place] not miss when refills are needed.  Our plans for today:  -due to the findings on the EKG, I advise you to go to the ED right now for further evaluation to work up your chest pain - Amlodipine 5 mg sent to pharmacy to take in addition to other medications - I sent in prescription for nitroglycerin to take if you experience pressure like chest pain.  - If you do not go to the ED and chest pain returns, I advise calling 911 -return in 2 weeks    We are checking some labs today, I will call you if they are abnormal will send you a MyChart message or a letter if they are normal.  If you do not hear about your labs in the next 2 weeks please let us know.  Take care and seek immediate care sooner if you develop any concerns.   Dr. Erick Alley, DO [facility]

## 2023-11-01 NOTE — Progress Notes (Unsigned)
    SUBJECTIVE:   CHIEF COMPLAINT / HPI:   Neuropathy Currently taking gabapentin 200 mg TID - increased from 100 mg TID since last visit.which has helped some but still in pain.  Attempted to increase to 300 mg but this made him too groggy.  Also using capsaicin cream.   Resistant hypertension Amlodipine 5 mg added on 10/27/2023 in addition to lisinopril 40 mg daily, metoprolol 50 mg twice daily, spironolactone 25 mg daily however patient was unable to get prescription for amlodipine from pharmacy for unknown reason.  HFrEF Currently on lisinopril, metoprolol, spironolactone, Jardiance and has torsemide at home to use as needed for shortness of breath, weight gain and swelling. Took Torsemide Fri sat and Sun once daily d/t swelling which helped. Also took potassium 20 mEq on one of those days.  Chest pain Intermittent chest pain for past 3 years. Pressure like, occurs when he becomes upset or exerts himself. Has not had it in a few weeks. Accompanied with SOB, improves with rest, generally lasting no more than 10 minutes   PERTINENT  PMH / PSH: IDA, HTN, HFrEF, T2DM, neuropathy  OBJECTIVE:   BP (!) 152/90   Pulse 79   Ht 5\' 10"  (1.778 m)   Wt 197 lb 6.4 oz (89.5 kg)   SpO2 100%   BMI 28.32 kg/m    General: NAD, pleasant, able to participate in exam Cardiac: RRR, no murmurs. Respiratory: CTAB, normal effort, No wheezes, rales or rhonchi Abdomen: nontender, nondistended, soft Extremities: No edema BLEs Skin: warm and dry Neuro: alert, no obvious focal deficits Psych: Normal affect and mood  ASSESSMENT/PLAN:   Neuropathy -Continue gabapentin 200 mg 3 times daily and capsaicin as needed  HFrEF (heart failure with reduced ejection fraction) (HCC) Euvolemic on exam.  Discussed stopping lisinopril and starting Entresto today however patient prefers to stay on current regimen at this time -Cardiology appointment scheduled for April -Continue lisinopril, metoprolol,  spironolactone and Jardiance.  Continue torsemide as needed for shortness of breath, weight gain and swelling -Take potassium 20 mEq when taking torsemide. -BMP today  Stable angina (HCC) Chest pain concerning for stable angina given it occurs when he upsets or exerts himself.  Not having chest pain today.  No recent EKG on file. Obtained baseline EKG during visit which was very concerning for inverted T waves in leads I and aVL.  Also with very slightly ST segment elevation (<1 box) in leads V1, V2, V3 although likely not significant. Due to inverted T waves, patient was advised to go straight to the ED for further evaluation.  He likely needs cardiac cath especially given previous EF 30%.  Patient politely declined going to the ED stating he understands my concern but has his mother he has to take care of.  -Advise going to ED -Rx SL nitroglycerin as needed for chest pain -Continue aspirin daily -EKG did not transmit into chart, will see if I can find original copy to put in media tab   Resistant hypertension Uncontrolled, never started amlodipine prescribed at previous visit. -New Rx amlodipine 5 mg to take in addition to lisinopril, metoprolol, spironolactone -Return in 2 weeks for BP check     Dr. Erick Alley, DO [facility]

## 2023-11-02 ENCOUNTER — Encounter: Payer: Self-pay | Admitting: Student

## 2023-11-02 ENCOUNTER — Telehealth: Payer: Self-pay | Admitting: Student

## 2023-11-02 DIAGNOSIS — I2089 Other forms of angina pectoris: Secondary | ICD-10-CM | POA: Insufficient documentation

## 2023-11-02 LAB — BASIC METABOLIC PANEL
BUN/Creatinine Ratio: 26 — ABNORMAL HIGH (ref 10–24)
BUN: 32 mg/dL — ABNORMAL HIGH (ref 8–27)
CO2: 19 mmol/L — ABNORMAL LOW (ref 20–29)
Calcium: 10.2 mg/dL (ref 8.6–10.2)
Chloride: 107 mmol/L — ABNORMAL HIGH (ref 96–106)
Creatinine, Ser: 1.22 mg/dL (ref 0.76–1.27)
Glucose: 103 mg/dL — ABNORMAL HIGH (ref 70–99)
Potassium: 4.6 mmol/L (ref 3.5–5.2)
Sodium: 143 mmol/L (ref 134–144)
eGFR: 65 mL/min/{1.73_m2} (ref >59)

## 2023-11-02 MED ORDER — GABAPENTIN 100 MG PO CAPS: 200.0000 mg | ORAL_CAPSULE | Freq: Three times a day (TID) | ORAL | Status: DC

## 2023-11-02 NOTE — Assessment & Plan Note (Signed)
-  Continue gabapentin 200 mg 3 times daily and capsaicin as needed

## 2023-11-02 NOTE — Assessment & Plan Note (Signed)
 Uncontrolled, never started amlodipine prescribed at previous visit. -New Rx amlodipine 5 mg to take in addition to lisinopril, metoprolol, spironolactone -Return in 2 weeks for BP check

## 2023-11-02 NOTE — Assessment & Plan Note (Addendum)
 Chest pain concerning for stable angina given it occurs when he upsets or exerts himself.  Not having chest pain today.  No recent EKG on file. Obtained baseline EKG during visit which was very concerning for inverted T waves in leads I and aVL.  Also with very slightly ST segment elevation (<1 box) in leads V1, V2, V3 although likely not significant. Due to inverted T waves, patient was advised to go straight to the ED for further evaluation.  He likely needs cardiac cath especially given previous EF 30%.  Patient politely declined going to the ED stating he understands my concern but has his mother he has to take care of.  -Advise going to ED -Rx SL nitroglycerin as needed for chest pain -Continue aspirin daily -EKG did not transmit into chart, will see if I can find original copy to put in media tab

## 2023-11-02 NOTE — Assessment & Plan Note (Signed)
 Euvolemic on exam.  Discussed stopping lisinopril and starting Entresto today however patient prefers to stay on current regimen at this time -Cardiology appointment scheduled for April -Continue lisinopril, metoprolol, spironolactone and Jardiance.  Continue torsemide as needed for shortness of breath, weight gain and swelling -Take potassium 20 mEq when taking torsemide. -BMP today

## 2023-11-03 NOTE — Telephone Encounter (Signed)
 error

## 2023-11-08 ENCOUNTER — Ambulatory Visit: Payer: Medicare PPO | Admitting: Podiatry

## 2023-11-08 ENCOUNTER — Encounter: Payer: Self-pay | Admitting: Podiatry

## 2023-11-08 DIAGNOSIS — L84 Corns and callosities: Secondary | ICD-10-CM | POA: Diagnosis not present

## 2023-11-08 DIAGNOSIS — E1142 Type 2 diabetes mellitus with diabetic polyneuropathy: Secondary | ICD-10-CM | POA: Diagnosis not present

## 2023-11-08 NOTE — Patient Instructions (Signed)
 VISIT SUMMARY:  Today, we discussed the painful bumps and burning sensation you have been experiencing in your feet. These symptoms are related to your diabetes and suggestive of diabetic neuropathy and porokeratosis. We reviewed your current medications and made some adjustments to help manage your symptoms more effectively.  YOUR PLAN:  -DIABETIC NEUROPATHY: Diabetic neuropathy is a type of nerve damage that can occur with diabetes, leading to symptoms like burning pain in the feet. We will increase your gabapentin dose to 200 mg, starting with your nighttime dose, and consider using topical lidocaine for additional relief. Please use gloves when applying any topical treatments to avoid skin irritation.  -POROKERATOSIS: Porokeratosis is a skin condition that causes small, painful bumps on the feet. We recommend using medicated corn pads with 17-20% salicylic acid. If these are not effective, you can increase to 40% salicylic acid. Monitor your skin for any cracking or bleeding and discontinue use if this occurs. If the salicylic acid is not effective, we may consider an in-office treatment with cantharone.  INSTRUCTIONS:  Please follow up with [place] if you experience any side effects from the increased gabapentin dose or if the salicylic acid treatments cause skin issues. If your symptoms do not improve, we may need to consider additional treatments.

## 2023-11-08 NOTE — Progress Notes (Signed)
 Subjective:  Patient ID: Jeffrey Stanton, male    DOB: 1955-12-15,  MRN: [id number]  Chief Complaint  Patient presents with   Foot Pain    Patient states he has some bumps are on the bottom of bilateral feet and the left one is worst the the right foot patient states, it started about 6 months ago. The bumps are at the bottom top under his 5th toe and hallux on left foot.     Discussed the use of AI scribe software for clinical note transcription with the patient, who gave verbal consent to proceed.  History of Present Illness   Jeffrey Stanton is a 68 year old male with diabetes who presents with painful bumps on the feet and burning sensation.  He experiences painful bumps on the bottoms of his feet, described as 'little bumps' that hurt. These bumps are persistent and often recur after subsiding for a few days. He has tried using corn pads to alleviate the discomfort, but they frequently detach with his socks. He does not wear socks to bed and has attempted to use buffer pads to offload pressure from the affected areas.  He also experiences a burning sensation in the soles of his feet, likened to 'walking on hot coals.' This burning sensation can last for a couple of days or longer and then subside for a similar duration before recurring. He has a history of diabetes diagnosed approximately 20 years ago. His recent A1c levels have been well-controlled, staying under 6. Despite this, he experiences symptoms suggestive of neuropathy.  He has been taking gabapentin 100 mg tablets two to three times a day consistently for the past six weeks to manage the burning pain. Increasing the dose to 300 mg at night caused dizziness, leading to a fall when he got up to use the bathroom. He has also tried topical creams for his feet, which caused itching when applied with bare hands, prompting him to switch to a roll-on form, though it was less effective.          Objective:    Physical Exam    EXTREMITIES: Feet are warm and well perfused with palpable pulses. Bilateral plantar midfoot porokeratosis, one lesion on each foot.       No images are attached to the encounter.    Results   Procedure: Trimming of porokeratosis Description: Trimming of porokeratosis on the plantar midfoot of both feet. Salinocaine cream applied to the lesions and covered with adhesive bandages.  LABS A1c: under 6      Assessment:   1. Callus of foot   2. Type 2 diabetes mellitus with diabetic polyneuropathy, without long-term current use of insulin (HCC)      Plan:  Patient was evaluated and treated and all questions answered.  Assessment and Plan    Diabetic Neuropathy Intermittent burning sensation due to diabetic neuropathy. Current gabapentin dose of 100 mg is low with limited efficacy. Higher doses cause side effects. - Increase gabapentin to 200 mg, starting with nighttime dose, can go to 300 a month after if tolerated - Consider topical lidocaine for additional relief. - Educated on using gloves for topical application.  Porokeratosis Porokeratosis on plantar midfoot bilaterally. Non-medicated corn pads ineffective. Salicylic acid recommended despite diabetes due to good control. - Apply medicated corn pads with 17-20% salicylic acid. - Increase to 40% if ineffective. - Monitor for skin cracking or bleeding; discontinue if occurs. - Consider in-office cantharone treatment if salicylic acid is ineffective.  Return if symptoms worsen or fail to improve.

## 2023-11-14 ENCOUNTER — Other Ambulatory Visit: Payer: Self-pay | Admitting: *Deleted

## 2023-11-14 DIAGNOSIS — I5020 Unspecified systolic (congestive) heart failure: Secondary | ICD-10-CM

## 2023-11-14 MED ORDER — SPIRONOLACTONE 25 MG PO TABS: 25.0000 mg | ORAL_TABLET | Freq: Every day | ORAL | 0 refills | Status: DC

## 2023-11-15 ENCOUNTER — Encounter: Payer: Self-pay | Admitting: Student

## 2023-11-15 ENCOUNTER — Ambulatory Visit (INDEPENDENT_AMBULATORY_CARE_PROVIDER_SITE_OTHER): Payer: Medicare PPO | Admitting: Student

## 2023-11-15 VITALS — BP 151/74 | HR 84 | Wt 196.8 lb

## 2023-11-15 DIAGNOSIS — I1A0 Resistant hypertension: Secondary | ICD-10-CM

## 2023-11-15 DIAGNOSIS — E119 Type 2 diabetes mellitus without complications: Secondary | ICD-10-CM | POA: Diagnosis not present

## 2023-11-15 MED ORDER — RYBELSUS 3 MG PO TABS: ORAL_TABLET | ORAL | 3 refills | Status: DC

## 2023-11-15 NOTE — Assessment & Plan Note (Addendum)
 Uncontrolled on repeat but improved from prior visit and he is asymptomatic.  We had previously discussed changing lisinopril to [facility] given HFrEF which he declined. Today, discussed option of increasing amlodipine to 10 mg however patient prefers to stay on current regimen and continue checking blood pressures at home. -Return in 2 weeks for RN BP check, patient to bring home BP cuff -If BP continues to be elevated, can consider increasing amlodipine -Has appointment scheduled cardiologist, Dr. Duke Salvia in April for HFrEF, discussed he will likely have medications changed at this appointment which he understands -Patient to return to see me after cardiology appointment

## 2023-11-15 NOTE — Patient Instructions (Signed)
 It was great to see you! Thank you for allowing me to participate in your care!  I recommend that you always bring your medications to each appointment as this makes it easy to ensure you are on the correct medications and helps [place] not miss when refills are needed.  Our plans for today:  - Schedule nurse only apt in 2 weeks for blood pressure check.  Bring your home blood pressure cuff to this appointment -Continue current medications -Refill of Rybelsus sent to pharmacy -Schedule appointment with me after you see Dr. Duke Salvia  Take care and seek immediate care sooner if you develop any concerns.   Dr. Erick Alley, DO [facility]

## 2023-11-15 NOTE — Progress Notes (Signed)
    SUBJECTIVE:   CHIEF COMPLAINT / HPI:   HTN Started on amlodipine 2 weeks ago in addition to lisinopril, metoprolol, spironolactone.  States he has been taking all his medications as prescribed.  Feeling like his normal self today, no complaints. Unsure exactly what blood pressures are at home but states they have been ranging from very good to high  PERTINENT  PMH / PSH: HTJN, T2DM, HErEF  OBJECTIVE:   BP (!) 151/74   Pulse 84   Wt 196 lb 12.8 oz (89.3 kg)   SpO2 100%   BMI 28.24 kg/m    General: NAD, pleasant, well-appearing Cardiac: RRR Respiratory: CTAB, normal effort, No wheezes, rales or rhonchi Abdomen:  nontender, nondistended, soft Extremities: no edema or cyanosis. Skin: warm and dry Neuro: alert, no obvious focal deficits Psych: Normal affect and mood  ASSESSMENT/PLAN:   Resistant hypertension Uncontrolled on repeat but improved from prior visit and he is asymptomatic.  We had previously discussed changing lisinopril to [facility] given HFrEF which he declined. Today, discussed option of increasing amlodipine to 10 mg however patient prefers to stay on current regimen and continue checking blood pressures at home. -Return in 2 weeks for RN BP check, patient to bring home BP cuff -If BP continues to be elevated, can consider increasing amlodipine -Has appointment scheduled cardiologist, Dr. Duke Salvia in April for HFrEF, discussed he will likely have medications changed at this appointment which he understands -Patient to return to see me after cardiology appointment   Rybelsus refilled.  Patient to return in May for A1c  Dr. Erick Alley, DO [facility]

## 2023-11-23 ENCOUNTER — Other Ambulatory Visit: Payer: Self-pay | Admitting: Student

## 2023-11-29 ENCOUNTER — Ambulatory Visit

## 2023-11-29 VITALS — BP 138/76 | HR 79

## 2023-11-29 DIAGNOSIS — Z0130 Encounter for examination of blood pressure without abnormal findings: Secondary | ICD-10-CM

## 2023-11-29 NOTE — Patient Instructions (Addendum)
 Referral sent to   [facility] [address] [place]  [address] [phone]

## 2023-11-29 NOTE — Progress Notes (Signed)
 Patient here today for BP check.      Last BP was on 11/15/2023 and was 151/74.  BP today is 138/76 with a pulse of 79.    Checked BP in right arm with regular adult cuff.   Patient is currently asymptomatic. He has been taking all BP medications as prescribed.   He has follow up scheduled with PCP on 01/06/24.   Veronda Prude, RN

## 2023-12-12 ENCOUNTER — Other Ambulatory Visit: Payer: Self-pay | Admitting: Student

## 2023-12-12 DIAGNOSIS — I5020 Unspecified systolic (congestive) heart failure: Secondary | ICD-10-CM

## 2023-12-15 ENCOUNTER — Ambulatory Visit: Payer: Medicare PPO | Admitting: Student

## 2023-12-17 ENCOUNTER — Other Ambulatory Visit: Payer: Self-pay | Admitting: Student

## 2023-12-17 DIAGNOSIS — I10 Essential (primary) hypertension: Secondary | ICD-10-CM

## 2023-12-26 ENCOUNTER — Other Ambulatory Visit: Payer: Self-pay | Admitting: Student

## 2023-12-26 DIAGNOSIS — M79671 Pain in right foot: Secondary | ICD-10-CM

## 2023-12-30 ENCOUNTER — Encounter (HOSPITAL_BASED_OUTPATIENT_CLINIC_OR_DEPARTMENT_OTHER): Payer: Self-pay | Admitting: Cardiovascular Disease

## 2023-12-30 ENCOUNTER — Ambulatory Visit (HOSPITAL_BASED_OUTPATIENT_CLINIC_OR_DEPARTMENT_OTHER): Payer: Medicare PPO | Admitting: Cardiovascular Disease

## 2023-12-30 VITALS — BP 154/70 | HR 82 | Ht 69.5 in | Wt 199.8 lb

## 2023-12-30 DIAGNOSIS — Z01812 Encounter for preprocedural laboratory examination: Secondary | ICD-10-CM

## 2023-12-30 DIAGNOSIS — I1A0 Resistant hypertension: Secondary | ICD-10-CM | POA: Diagnosis not present

## 2023-12-30 DIAGNOSIS — I252 Old myocardial infarction: Secondary | ICD-10-CM

## 2023-12-30 DIAGNOSIS — E7800 Pure hypercholesterolemia, unspecified: Secondary | ICD-10-CM

## 2023-12-30 DIAGNOSIS — G479 Sleep disorder, unspecified: Secondary | ICD-10-CM

## 2023-12-30 DIAGNOSIS — R600 Localized edema: Secondary | ICD-10-CM | POA: Diagnosis not present

## 2023-12-30 DIAGNOSIS — I5020 Unspecified systolic (congestive) heart failure: Secondary | ICD-10-CM

## 2023-12-30 MED ORDER — ENTRESTO 97-103 MG PO TABS: 1.0000 | ORAL_TABLET | Freq: Two times a day (BID) | ORAL | 3 refills | Status: AC
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
## 2023-12-30 NOTE — Patient Instructions (Addendum)
 Medication Instructions:  Your physician has recommended you make the following change in your medication:  STOP Lisinopril  STOP Amlodipine  START Entresto  97-103 on Sunday TAKE Metoprolol  Tartrate 100 mg two hours prior to CT  *If you need a refill on your cardiac medications before your next appointment, please call your pharmacy*  LAB: BMET TODAY   Testing/Procedures: Your physician has requested that you have an echocardiogram. Echocardiography is a painless test that uses sound waves to create images of your heart. It provides your doctor with information about the size and shape of your heart and how well your heart's chambers and valves are working. This procedure takes approximately one hour. There are no restrictions for this procedure. Please do NOT wear cologne, perfume, aftershave, or lotions (deodorant is allowed). Please arrive 15 minutes prior to your appointment time.  Please note: We ask at that you not bring children with you during ultrasound (echo/ vascular) testing. Due to room size and safety concerns, children are not allowed in the ultrasound rooms during exams. Our front office staff cannot provide observation of children in our lobby area while testing is being conducted. An adult accompanying a patient to their appointment will only be allowed in the ultrasound room at the discretion of the ultrasound technician under special circumstances. We apologize for any inconvenience.     Your cardiac CT will be scheduled at one of the below locations:   [facility] [address] [place], [place] [address] [phone]  OR   Jeralene Mom. [facility] and Vascular Tower [address]  [place], [place] [address] Opening January 02, 2024  If scheduled at [facility], please arrive at the [facility] and Children's Entrance (Entrance C2) of [facility] 30 minutes prior to test start time. You can use the FREE valet parking offered at entrance C  (encouraged to control the heart rate for the test)  Proceed to the [facility] Radiology Department (first floor) to check-in and test prep.   All radiology patients and guests should use entrance C2 at [facility], accessed from [facility], even though the hospital's physical address listed is [address].    If scheduled at the Heart and Vascular Tower at [address], please enter the parking lot using the [address] entrance and use the FREE valet service at the patient drop-off area. Enter the buidling and check-in with registration on the main floor.  If scheduled at [facility] or [facility], please arrive 15 mins early for check-in and test prep.  There is spacious parking and easy access to the radiology department from the [facility] Heart and Vascular entrance. Please enter here and check-in with the desk attendant.   If scheduled at [facility], please arrive 30 minutes early for check-in and test prep.  Please follow these instructions carefully (unless otherwise directed):  An IV will be required for this test and Nitroglycerin  will be given.  Hold all erectile dysfunction medications at least 3 days (72 hrs) prior to test. (Ie viagra, cialis, sildenafil, tadalafil, etc)   On the Night Before the Test: Be sure to Drink plenty of water. Do not consume any caffeinated/decaffeinated beverages or chocolate 12 hours prior to your test. Do not take any antihistamines 12 hours prior to your test.  On the Day of the Test: Drink plenty of water until 1 hour prior to the test. Do not eat any food 1 hour prior to  test. You may take your regular medications prior to the test.  Take metoprolol  (Lopressor ) two hours prior to test. If you take Furosemide /Hydrochlorothiazide /Spironolactone /Chlorthalidone , please HOLD on the morning of the test. Patients who wear a continuous glucose monitor MUST  remove the device prior to scanning.       After the Test: Drink plenty of water. After receiving IV contrast, you may experience a mild flushed feeling. This is normal. On occasion, you may experience a mild rash up to 24 hours after the test. This is not dangerous. If this occurs, you can take Benadryl 25 mg, Zyrtec, Claritin, or Allegra and increase your fluid intake. (Patients taking Tikosyn should avoid Benadryl, and may take Zyrtec, Claritin, or Allegra) If you experience trouble breathing, this can be serious. If it is severe call 911 IMMEDIATELY. If it is mild, please call our office.  We will call to schedule your test 2-4 weeks out understanding that some insurance companies will need an authorization prior to the service being performed.   For more information and frequently asked questions, please visit our website : [email or web address]  For non-scheduling related questions, please contact the cardiac imaging nurse navigator should you have any questions/concerns: Cardiac Imaging Nurse Navigators Direct Office Dial: [phone]   For scheduling needs, including cancellations and rescheduling, please call [place], [phone].   Follow-Up: At [facility], you and your health needs are our priority.  As part of our continuing mission to provide you with exceptional heart care, our providers are all part of one team.  This team includes your primary Cardiologist (physician) and Advanced Practice Providers or APPs (Physician Assistants and Nurse Practitioners) who all work together to provide you with the care you need, when you need it.  Your next appointment:   Follow up in 1 month with Slater Duncan, NP or Neomi Banks, NP Follow up in 6 months with Dr. Theodis Fiscal  We recommend signing up for the patient portal called "MyChart".  Sign up information is provided on this After Visit Summary.  MyChart is used to connect with patients for Virtual Visits  (Telemedicine).  Patients are able to view lab/test results, encounter notes, upcoming appointments, etc.  Non-urgent messages can be sent to your provider as well.   To learn more about what you can do with MyChart, go to [email or web address].   Other Instructions Track your blood pressures.

## 2023-12-31 LAB — BASIC METABOLIC PANEL WITH GFR
BUN/Creatinine Ratio: 20 (ref 10–24)
BUN: 25 mg/dL (ref 8–27)
CO2: 21 mmol/L (ref 20–29)
Calcium: 10.3 mg/dL — ABNORMAL HIGH (ref 8.6–10.2)
Chloride: 103 mmol/L (ref 96–106)
Creatinine, Ser: 1.22 mg/dL (ref 0.76–1.27)
Glucose: 151 mg/dL — ABNORMAL HIGH (ref 70–99)
Potassium: 4.3 mmol/L (ref 3.5–5.2)
Sodium: 141 mmol/L (ref 134–144)
eGFR: 65 mL/min/{1.73_m2} (ref >59)

## 2024-01-03 ENCOUNTER — Encounter (HOSPITAL_BASED_OUTPATIENT_CLINIC_OR_DEPARTMENT_OTHER): Payer: Self-pay | Admitting: Cardiovascular Disease

## 2024-01-06 ENCOUNTER — Ambulatory Visit (INDEPENDENT_AMBULATORY_CARE_PROVIDER_SITE_OTHER): Admitting: Student

## 2024-01-06 VITALS — BP 132/75 | HR 81 | Ht 69.5 in | Wt 193.2 lb

## 2024-01-06 DIAGNOSIS — I5020 Unspecified systolic (congestive) heart failure: Secondary | ICD-10-CM

## 2024-01-06 DIAGNOSIS — I1A0 Resistant hypertension: Secondary | ICD-10-CM

## 2024-01-06 DIAGNOSIS — E1142 Type 2 diabetes mellitus with diabetic polyneuropathy: Secondary | ICD-10-CM | POA: Diagnosis not present

## 2024-01-06 DIAGNOSIS — Z23 Encounter for immunization: Secondary | ICD-10-CM | POA: Diagnosis not present

## 2024-01-06 DIAGNOSIS — D509 Iron deficiency anemia, unspecified: Secondary | ICD-10-CM | POA: Diagnosis not present

## 2024-01-06 DIAGNOSIS — Z7985 Long-term (current) use of injectable non-insulin antidiabetic drugs: Secondary | ICD-10-CM | POA: Diagnosis not present

## 2024-01-06 LAB — POCT GLYCOSYLATED HEMOGLOBIN (HGB A1C): HbA1c, POC (controlled diabetic range): 7.5 % — AB (ref 0.0–7.0)

## 2024-01-06 MED ORDER — RYBELSUS 7 MG PO TABS: ORAL_TABLET | ORAL | 0 refills | Status: DC

## 2024-01-06 MED ORDER — ZOSTER VAC RECOMB ADJUVANTED 50 MCG/0.5ML IM SUSR: 0.5000 mL | Freq: Once | INTRAMUSCULAR | 0 refills | Status: AC

## 2024-01-06 NOTE — Progress Notes (Signed)
    SUBJECTIVE:   CHIEF COMPLAINT / HPI:   Jeffrey Stanton is a 68 year old male with heart failure and diabetes who presents for medication management and follow-up.  He has recently adjusted his heart failure medications after seeing cardiology, adding Entresto  and discontinuing amlodipine  and lisinopril , while continuing metoprolol , spironolactone  and Jardiance . He has not used torsemide  recently, indicating no swelling and no dyspnea.  For diabetes, he is on Rybelsus  and Jardiance . His blood sugar levels have been slightly increasing, with a recent reading of 200 mg/dL at home. He stopped glipizide  three months ago, which correlates with the rise in blood sugar.  He takes an iron  supplement every other day for IDA.   PERTINENT  PMH / PSH: T2DM, HErEF, IDA  OBJECTIVE:   BP 132/75   Pulse 81   Ht 5' 9.5" (1.765 m)   Wt 193 lb 3.2 oz (87.6 kg)   SpO2 99%   BMI 28.12 kg/m    General: NAD, pleasant, able to participate in exam Cardiac: RRR, no murmurs. Respiratory: CTAB, normal effort, No wheezes, rales or rhonchi Extremities: no edema BLEs Skin: warm and dry Neuro: alert, no obvious focal deficits Psych: Normal affect and mood  ASSESSMENT/PLAN:   HFrEF (heart failure with reduced ejection fraction) (HCC) Recent medication changes include discontinuation of amlodipine  and lisinopril , initiation of Entresto . Continuing metoprolol , spironolactone  and Jardiance .He reports feeling well on current regimen. He appears euvolemic on exam.  - Continue Entresto , spironolactone , metoprolol  and Jardiance  as prescribed. - f/u with cardiology as planned  - BMP today given he started entresto  about a week ago  Anemia Previous iron  studies showed nml hgb, high iron  and low ferritin. Continuing iron  supplementation every other day. Plan to reassess iron  status with upcoming labs. Previous cologuard was pos concerning for colon cancer as possible cause of IDA. Pt continue to refuse  colonoscopy today and is able to voice understanding of the possible implications, worse being untreated colon cancer.  - Continue iron  supplementation every other day. - Order CBC, iron  panel, and ferritin to reassess iron  status.  T2DM (type 2 diabetes mellitus) (HCC) A1c slightly elevated at 7.5%. Recent discontinuation of glipizide .   Decision to increase Rybelsus  dosage instead of restarting glipizide . - Increase Rybelsus  to 7 mg daily and continue jardiance  10 mg daily - Order microalbumin creatinine ratio test at next visit (states he does not have to urinate) - Return in three months to meet new PCP and recheck A1c.   Shingrix vaccine ordered   Dr. Glenn Lange, DO [facility]

## 2024-01-06 NOTE — Assessment & Plan Note (Signed)
 Recent medication changes include discontinuation of amlodipine  and lisinopril , initiation of Entresto . Continuing metoprolol , spironolactone  and Jardiance .He reports feeling well on current regimen. He appears euvolemic on exam.  - Continue Entresto , spironolactone , metoprolol  and Jardiance  as prescribed. - f/u with cardiology as planned  - BMP today given he started entresto  about a week ago

## 2024-01-06 NOTE — Assessment & Plan Note (Addendum)
 Previous iron  studies showed nml hgb, high iron  and low ferritin. Continuing iron  supplementation every other day. Plan to reassess iron  status with upcoming labs. Previous cologuard was pos concerning for colon cancer as possible cause of IDA. Pt continue to refuse colonoscopy today and is able to voice understanding of the possible implications, worse being untreated colon cancer.  - Continue iron  supplementation every other day. - Order CBC, iron  panel, and ferritin to reassess iron  status.

## 2024-01-06 NOTE — Patient Instructions (Addendum)
 It was great to see you! Thank you for allowing me to participate in your care!  I recommend that you always bring your medications to each appointment as this makes it easy to ensure you are on the correct medications and helps us  not miss when refills are needed.  Our plans for today:  - New prescription for rybelsus  7 mg sent to pharmacy as A1c was elevated to 7.5. Stop the 3 mg dose.  - Return in 3 months to meet new PCP and check A1c or sooner if needed  We are checking some labs today, I will call you if they are abnormal will send you a MyChart message or a letter if they are normal.  If you do not hear about your labs in the next 2 weeks please let us  know.  Take care and seek immediate care sooner if you develop any concerns.   Dr. Glenn Lange, DO [facility]

## 2024-01-06 NOTE — Assessment & Plan Note (Signed)
 A1c slightly elevated at 7.5%. Recent discontinuation of glipizide .   Decision to increase Rybelsus  dosage instead of restarting glipizide . - Increase Rybelsus  to 7 mg daily and continue jardiance  10 mg daily - Order microalbumin creatinine ratio test at next visit (states he does not have to urinate) - Return in three months to meet new PCP and recheck A1c.

## 2024-01-07 LAB — CBC
Hematocrit: 44.7 % (ref 37.5–51.0)
Hemoglobin: 14.6 g/dL (ref 13.0–17.7)
MCH: 27.2 pg (ref 26.6–33.0)
MCHC: 32.7 g/dL (ref 31.5–35.7)
MCV: 83 fL (ref 79–97)
Platelets: 190 10*3/uL (ref 150–450)
RBC: 5.36 x10E6/uL (ref 4.14–5.80)
RDW: 15.9 % — ABNORMAL HIGH (ref 11.6–15.4)
WBC: 7.8 10*3/uL (ref 3.4–10.8)

## 2024-01-07 LAB — BASIC METABOLIC PANEL WITH GFR
BUN/Creatinine Ratio: 17 (ref 10–24)
BUN: 21 mg/dL (ref 8–27)
CO2: 21 mmol/L (ref 20–29)
Calcium: 10.4 mg/dL — ABNORMAL HIGH (ref 8.6–10.2)
Chloride: 106 mmol/L (ref 96–106)
Creatinine, Ser: 1.23 mg/dL (ref 0.76–1.27)
Glucose: 175 mg/dL — ABNORMAL HIGH (ref 70–99)
Potassium: 4.6 mmol/L (ref 3.5–5.2)
Sodium: 143 mmol/L (ref 134–144)
eGFR: 65 mL/min/{1.73_m2} (ref >59)

## 2024-01-07 LAB — IRON,TIBC AND FERRITIN PANEL
Ferritin: 33 ng/mL (ref 30–400)
Iron Saturation: 33 % (ref 15–55)
Iron: 138 ug/dL (ref 38–169)
Total Iron Binding Capacity: 417 ug/dL (ref 250–450)
UIBC: 279 ug/dL (ref 111–343)

## 2024-01-10 ENCOUNTER — Telehealth: Payer: Self-pay | Admitting: Student

## 2024-01-10 NOTE — Telephone Encounter (Signed)
 Called patient to discuss recent lab results.  Recommend he continue his every other day iron  supplement which has improved his anemia and iron  stores. Also recommend he come in for PTH and calcium  levels as his previous 2 BMPs show slightly elevated calcium .  Lab only appointment scheduled for tomorrow.

## 2024-01-11 ENCOUNTER — Other Ambulatory Visit: Payer: Self-pay

## 2024-01-12 LAB — PTH, INTACT AND CALCIUM
Calcium: 9.6 mg/dL (ref 8.6–10.2)
PTH: 92 pg/mL — ABNORMAL HIGH (ref 15–65)

## 2024-01-13 ENCOUNTER — Telehealth: Payer: Self-pay | Admitting: Student

## 2024-01-13 DIAGNOSIS — E213 Hyperparathyroidism, unspecified: Secondary | ICD-10-CM | POA: Insufficient documentation

## 2024-01-13 NOTE — Telephone Encounter (Signed)
 Called pt to discuss PTH and Ca levels indicating likely primary hyperparathyroidism given PTH significantly elevated to 92. Pt agrees to referral to endocrinology for further evaluation which has been placed

## 2024-01-16 ENCOUNTER — Other Ambulatory Visit: Payer: Self-pay | Admitting: Student

## 2024-01-16 ENCOUNTER — Telehealth (HOSPITAL_COMMUNITY): Payer: Self-pay | Admitting: Cardiovascular Disease

## 2024-01-16 NOTE — Telephone Encounter (Signed)
 Patient cancelled echocardiogram scheduled at the [facility] location for reason below:   01/16/2024 1:42 PM [id number], AMANDA  Cancel Rsn: Patient (patient has other health issues, will wait and see what to do   Order will be removed from the echo WQ.

## 2024-01-16 NOTE — Telephone Encounter (Signed)
 FYI

## 2024-01-23 ENCOUNTER — Other Ambulatory Visit (HOSPITAL_BASED_OUTPATIENT_CLINIC_OR_DEPARTMENT_OTHER)

## 2024-01-24 ENCOUNTER — Telehealth: Payer: Self-pay

## 2024-01-24 NOTE — Telephone Encounter (Signed)
 Patient calls nurse line in regards to Gabapentin .   He reports he does not think this medication is working for him.   He reports he took 3 tabs at once as he was told, however he reports he was "fast asleep."   He reports he takes this for his Neuropathy pain. He is requesting an alternative medication with less side effects "that works better."   Advised will forward to PCP.

## 2024-01-26 NOTE — Telephone Encounter (Signed)
 Patient contacted.   He reports since stopping Gabapentin  he has felt better. He reports he has been using a cream on his feet that has helped a lot.   He reports he will call for an apt if symptoms return or become unmanageable at home.

## 2024-02-07 ENCOUNTER — Ambulatory Visit (HOSPITAL_BASED_OUTPATIENT_CLINIC_OR_DEPARTMENT_OTHER): Admitting: Nurse Practitioner

## 2024-02-13 ENCOUNTER — Ambulatory Visit (INDEPENDENT_AMBULATORY_CARE_PROVIDER_SITE_OTHER): Admitting: Student

## 2024-02-13 VITALS — BP 146/96 | HR 93 | Ht 70.0 in | Wt 188.6 lb

## 2024-02-13 DIAGNOSIS — E213 Hyperparathyroidism, unspecified: Secondary | ICD-10-CM

## 2024-02-13 DIAGNOSIS — B349 Viral infection, unspecified: Secondary | ICD-10-CM

## 2024-02-13 NOTE — Progress Notes (Unsigned)
     SUBJECTIVE:   CHIEF COMPLAINT / HPI:   Viral Syndrome Sneeze and runny nose since Friday. Now getting better. Headache. Made appt earlier in the day. Feeling better now. Wouldn't have made appt if he knew he was going to improve. No CP, difficulty breathing or fever. Eating and drinking.   Hyperparathyroidism Labs done by Dr. Rochelle Chu suggestive of secondary hyperparathyroidism. No recent Vit D but historically low.   OBJECTIVE:   BP (!) 146/96   Pulse 93   Ht 5' 10 (1.778 m)   Wt 188 lb 9.6 oz (85.5 kg)   SpO2 99%   BMI 27.06 kg/m   Gen: Well appearing and NAD HENT: Conjunctivae noninjected. Nares patent, scant dried rhinorrhea. Oropharynx clear.  Cardio: RRR Pulm: Normal WOB on RA, lungs clear in all fields, speaking in full sentences.   ASSESSMENT/PLAN:   Assessment & Plan Viral syndrome URI symptoms, already markedly improved. Anticipate self-resolving course. No further workup or intervention warranted at present.  Hyperparathyroidism (HCC) Likely 2/2, suspect hypovitaminosis D.  - PTH, Ca, and Vit D      J Lark Plum, MD [facility]

## 2024-02-13 NOTE — Patient Instructions (Signed)
 Mr. Jeffrey Stanton, Jeffrey Stanton you're feeling better. Let's sit on it. I expect it will continue to get better on its own. Obviously if it doesn't give me a call.   Alexa Andrews, MD

## 2024-02-14 LAB — SPECIMEN STATUS REPORT

## 2024-02-15 NOTE — Addendum Note (Signed)
 Addended by: Darell Echevaria B on: 02/15/2024 10:58 AM   Modules accepted: Level of Service

## 2024-02-15 NOTE — Assessment & Plan Note (Signed)
 Likely 2/2, suspect hypovitaminosis D.  - PTH, Ca, and Vit D

## 2024-02-16 LAB — PTH, INTACT AND CALCIUM
Calcium: 10 mg/dL (ref 8.6–10.2)
PTH: 51 pg/mL (ref 15–65)

## 2024-02-16 LAB — VITAMIN D 25 HYDROXY (VIT D DEFICIENCY, FRACTURES): Vit D, 25-Hydroxy: 20.6 ng/mL — ABNORMAL LOW (ref 30.0–100.0)

## 2024-02-22 ENCOUNTER — Ambulatory Visit: Payer: Self-pay | Admitting: Student

## 2024-02-22 DIAGNOSIS — E559 Vitamin D deficiency, unspecified: Secondary | ICD-10-CM

## 2024-02-22 MED ORDER — VITAMIN D (ERGOCALCIFEROL) 1.25 MG (50000 UNIT) PO CAPS: 50000.0000 [IU] | ORAL_CAPSULE | ORAL | 0 refills | Status: DC

## 2024-03-12 ENCOUNTER — Other Ambulatory Visit: Payer: Self-pay

## 2024-03-12 DIAGNOSIS — I1A0 Resistant hypertension: Secondary | ICD-10-CM

## 2024-03-12 DIAGNOSIS — I5020 Unspecified systolic (congestive) heart failure: Secondary | ICD-10-CM

## 2024-03-12 MED ORDER — METOPROLOL TARTRATE 50 MG PO TABS: 50.0000 mg | ORAL_TABLET | Freq: Two times a day (BID) | ORAL | 2 refills | Status: DC

## 2024-03-12 MED ORDER — SPIRONOLACTONE 25 MG PO TABS: 25.0000 mg | ORAL_TABLET | Freq: Every day | ORAL | 0 refills | Status: DC

## 2024-03-19 ENCOUNTER — Telehealth: Payer: Self-pay

## 2024-03-19 NOTE — Telephone Encounter (Signed)
 Patient calls nurse line requesting form completion.  He reports social security will be faxing over a form for PCP review. He reports requesting additional benefits from social security if able.   Advised will forward to PCP, however he may need to make an apt depending on requested information.

## 2024-03-26 ENCOUNTER — Telehealth (HOSPITAL_COMMUNITY): Payer: Self-pay | Admitting: *Deleted

## 2024-03-26 NOTE — Telephone Encounter (Signed)
 Reaching out to patient to offer assistance regarding upcoming cardiac imaging study; pt verbalizes understanding of appt date/time, parking situation and where to check in, pre-test NPO status and medications ordered, and verified current allergies; name and call back number provided for further questions should they arise  Chantal Requena RN Navigator Cardiac Imaging [facility] [phone] office [phone] cell..  Per instructions from the office, patient to take 100mg  metoprolol  tartrate on the day of his test.

## 2024-03-27 ENCOUNTER — Ambulatory Visit (HOSPITAL_COMMUNITY)
Admission: RE | Admit: 2024-03-27 | Source: Ambulatory Visit | Attending: Cardiovascular Disease | Admitting: Cardiovascular Disease

## 2024-04-12 ENCOUNTER — Other Ambulatory Visit: Payer: Self-pay

## 2024-04-12 DIAGNOSIS — E1142 Type 2 diabetes mellitus with diabetic polyneuropathy: Secondary | ICD-10-CM

## 2024-04-13 ENCOUNTER — Telehealth (HOSPITAL_BASED_OUTPATIENT_CLINIC_OR_DEPARTMENT_OTHER): Payer: Self-pay | Admitting: *Deleted

## 2024-04-13 MED ORDER — RYBELSUS 7 MG PO TABS: ORAL_TABLET | ORAL | 0 refills | Status: DC

## 2024-04-13 MED ORDER — EMPAGLIFLOZIN 10 MG PO TABS: 10.0000 mg | ORAL_TABLET | Freq: Every day | ORAL | 0 refills | Status: DC

## 2024-04-13 NOTE — Telephone Encounter (Signed)
-----   Message from [facility] sent at 04/12/2024  5:16 PM EDT ----- Regarding: RE: needs BMP for CCTA tues 7/22 OK thank you.    Newell, if he cn afford a [employer], that would be an OK option as well.  TCR ----- Message ----- From: Prentiss Camie HERO, RN Sent: 03/28/2024   1:37 PM EDT To: Annabella Scarce, MD; Newell KATHEE Birk, LPN; # Subject: RE: needs BMP for CCTA tues 7/22               Hey following up- this patient was scheduled this week for CCTA but when he got his phone call from pre-service center he said he couldn't afford his co-pay.. I told him about the payment plan option but he insisted on cancelling all together.  Please reach out to patient if he still wants this test or if we can cancel test/remove from WQ.  Thank you, Camie Prentiss ----- Message ----- From: Birk Newell KATHEE, LPN Sent: 2/78/7974   7:06 PM EDT To: Camie HERO Prentiss, RN Subject: RE: needs BMP for CCTA tues 7/22               Sorry Camie was off Friday and playing catch up [facility] STAT one tomorrow is no trouble ----- Message ----- From: Prentiss Camie HERO, RN Sent: 03/23/2024   9:38 PM EDT To: Annabella Scarce, MD; Newell KATHEE Birk, LPN; # Subject: needs BMP for CCTA tues 7/22                   Good evening,  This patient needs an updated BMP for Tuesday 7/22 CCTA. Alternative option is we can get an istat creat on the day of the test.  Thank you, Camie ORN

## 2024-04-13 NOTE — Telephone Encounter (Signed)
 Left message to call back

## 2024-04-25 NOTE — Telephone Encounter (Signed)
 Left message to call back.

## 2024-05-14 ENCOUNTER — Ambulatory Visit (HOSPITAL_BASED_OUTPATIENT_CLINIC_OR_DEPARTMENT_OTHER): Admitting: Nurse Practitioner

## 2024-05-14 ENCOUNTER — Encounter (HOSPITAL_BASED_OUTPATIENT_CLINIC_OR_DEPARTMENT_OTHER): Payer: Self-pay | Admitting: Nurse Practitioner

## 2024-05-14 VITALS — BP 140/80 | HR 79 | Ht 70.0 in | Wt 186.4 lb

## 2024-05-14 DIAGNOSIS — I10 Essential (primary) hypertension: Secondary | ICD-10-CM | POA: Diagnosis not present

## 2024-05-14 DIAGNOSIS — I2510 Atherosclerotic heart disease of native coronary artery without angina pectoris: Secondary | ICD-10-CM

## 2024-05-14 DIAGNOSIS — I5020 Unspecified systolic (congestive) heart failure: Secondary | ICD-10-CM

## 2024-05-14 DIAGNOSIS — R079 Chest pain, unspecified: Secondary | ICD-10-CM

## 2024-05-14 DIAGNOSIS — I1A0 Resistant hypertension: Secondary | ICD-10-CM

## 2024-05-14 DIAGNOSIS — R9430 Abnormal result of cardiovascular function study, unspecified: Secondary | ICD-10-CM | POA: Diagnosis not present

## 2024-05-14 DIAGNOSIS — E785 Hyperlipidemia, unspecified: Secondary | ICD-10-CM | POA: Diagnosis not present

## 2024-05-14 NOTE — Patient Instructions (Signed)
 Medication Instructions:   Your physician recommends that you continue on your current medications as directed. Please refer to the Current Medication list given to you today.   *If you need a refill on your cardiac medications before your next appointment, please call your pharmacy*  Lab Work:  Your physician recommends that you return for a FASTING lipid/CMET/LPA, fasting after midnight. Patient was given paperwork today.   If you have labs (blood work) drawn today and your tests are completely normal, you will receive your results only by: MyChart Message (if you have MyChart) OR A paper copy in the mail If you have any lab test that is abnormal or we need to change your treatment, we will call you to review the results.  Testing/Procedures: Patient will check with insurance company before scheduling.   Your physician has requested that you have an echocardiogram. Echocardiography is a painless test that uses sound waves to create images of your heart. It provides your doctor with information about the size and shape of your heart and how well your heart's chambers and valves are working. This procedure takes approximately one hour. There are no restrictions for this procedure. Please do NOT wear cologne,  aftershave, or lotions (deodorant is allowed). Please arrive 15 minutes prior to your appointment time.  Follow-Up: At [facility], you and your health needs are our priority.  As part of our continuing mission to provide you with exceptional heart care, our providers are all part of one team.  This team includes your primary Cardiologist (physician) and Advanced Practice Providers or APPs (Physician Assistants and Nurse Practitioners) who all work together to provide you with the care you need, when you need it.  Your next appointment:   6 month(s)  Provider:   Annabella Scarce, MD, Rosaline Bane, NP, or Reche Finder, NP    We recommend signing up for the patient  portal called MyChart.  Sign up information is provided on this After Visit Summary.  MyChart is used to connect with patients for Virtual Visits (Telemedicine).  Patients are able to view lab/test results, encounter notes, upcoming appointments, etc.  Non-urgent messages can be sent to your provider as well.   To learn more about what you can do with MyChart, go to [email or web address].   Other Instructions  Your physician wants you to follow-up in: 6 months.  You will receive a reminder letter in the mail two months in advance. If you don't receive a letter, please call our office to schedule the follow-up appointment.  HOW TO TAKE YOUR BLOOD PRESSURE  Rest 5 minutes before taking your blood pressure. Don't  smoke or drink caffeinated beverages for at least 30 minutes before. Take your blood pressure before (not after) you eat. Sit comfortably with your back supported and both feet on the floor ( don't cross your legs). Elevate your arm to heart level on a table or a desk. Use the proper sized cuff.  It should fit smoothly and snugly around your bare upper arm.  There should be  Enough room to slip a fingertip under the cuff.  The bottom edge of the cuff should be 1 inch above the crease Of the elbow. Please monitor your blood pressure once daily 2 hours after your am medication. Your goal for your  blood pressure is below 140 (systolic) top number or over 80 ( diastolic) bottom number.  Consecutively.  Please call our office at [phone] or send Mychart message.     ----Avoid  cold medicines with D or DM at the end of them----     Patient will consider having a CT coronary calcium  score.   Test locations:  [facility] at [facility]  [place] at [facility]  This is $99 out of pocket.   Coronary CalciumScan A coronary calcium  scan is an imaging test used to look for  deposits of calcium  and other fatty materials (plaques) in the inner lining of the blood vessels of the heart (coronary arteries). These deposits of calcium  and plaques can partly clog and narrow the coronary arteries without producing any symptoms or warning signs. This puts a person at risk for a heart attack. This test can detect these deposits before symptoms develop. Tell a health care provider about: Any allergies you have. All medicines you are taking, including vitamins, herbs, eye drops, creams, and over-the-counter medicines. Any problems you or family members have had with anesthetic medicines. Any blood disorders you have. Any surgeries you have had. Any medical conditions you have. Whether you are pregnant or may be pregnant. What are the risks? Generally, this is a safe procedure. However, problems may occur, including: Harm to a pregnant woman and her unborn baby. This test involves the use of radiation. Radiation exposure can be dangerous to a pregnant woman and her unborn baby. If you are pregnant, you generally should not have this procedure done. Slight increase in the risk of cancer. This is because of the radiation involved in the test. What happens before the procedure? No preparation is needed for this procedure. What happens during the procedure? You will undress and remove any jewelry around your neck or chest. You will put on a hospital gown. Sticky electrodes will be placed on your chest. The electrodes will be connected to an electrocardiogram (ECG) machine to record a tracing of the electrical activity of your heart. A CT scanner will take pictures of your heart. During this time, you will be asked to lie still and hold your breath for 2-3 seconds while a picture of your heart is being taken. The procedure may vary among health care providers and hospitals. What happens after the procedure? You can get dressed. You can return to your normal activities. It is up to  you to get the results of your test. Ask your health care provider, or the department that is doing the test, when your results will be ready. Summary A coronary calcium  scan is an imaging test used to look for deposits of calcium  and other fatty materials (plaques) in the inner lining of the blood vessels of the heart (coronary arteries). Generally, this is a safe procedure. Tell your health care provider if you are pregnant or may be pregnant. No preparation is needed for this procedure. A CT scanner will take pictures of your heart. You can return to your normal activities after the scan is done. This information is not intended to replace advice given to you by your health care provider. Make sure you discuss any questions you have with your health care provider. Document Released: 02/19/2008 Document Revised: 07/12/2016 Document Reviewed: 07/12/2016 Elsevier Interactive Patient Education  2017 [employer].
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
## 2024-05-16 DIAGNOSIS — I5020 Unspecified systolic (congestive) heart failure: Secondary | ICD-10-CM | POA: Diagnosis not present

## 2024-05-17 ENCOUNTER — Ambulatory Visit: Payer: Self-pay | Admitting: Nurse Practitioner

## 2024-05-17 LAB — LIPID PANEL
Chol/HDL Ratio: 2.8 ratio (ref 0.0–5.0)
Cholesterol, Total: 106 mg/dL (ref 100–199)
HDL: 38 mg/dL — ABNORMAL LOW (ref >39)
LDL Chol Calc (NIH): 36 mg/dL (ref 0–99)
Triglycerides: 198 mg/dL — ABNORMAL HIGH (ref 0–149)
VLDL Cholesterol Cal: 32 mg/dL (ref 5–40)

## 2024-05-17 LAB — COMPREHENSIVE METABOLIC PANEL WITH GFR
ALT: 27 IU/L (ref 0–44)
AST: 21 IU/L (ref 0–40)
Albumin: 4.3 g/dL (ref 3.9–4.9)
Alkaline Phosphatase: 66 IU/L (ref 44–121)
BUN/Creatinine Ratio: 17 (ref 10–24)
BUN: 18 mg/dL (ref 8–27)
Bilirubin Total: 0.2 mg/dL (ref 0.0–1.2)
CO2: 20 mmol/L (ref 20–29)
Calcium: 9.6 mg/dL (ref 8.6–10.2)
Chloride: 104 mmol/L (ref 96–106)
Creatinine, Ser: 1.09 mg/dL (ref 0.76–1.27)
Globulin, Total: 2.5 g/dL (ref 1.5–4.5)
Glucose: 138 mg/dL — ABNORMAL HIGH (ref 70–99)
Potassium: 3.8 mmol/L (ref 3.5–5.2)
Sodium: 139 mmol/L (ref 134–144)
Total Protein: 6.8 g/dL (ref 6.0–8.5)
eGFR: 75 mL/min/1.73 (ref >59)

## 2024-05-17 LAB — LIPOPROTEIN A (LPA): Lipoprotein (a): 21.2 nmol/L (ref <75.0)

## 2024-05-29 NOTE — Progress Notes (Signed)
 Jeffrey Stanton                                          MRN: [id number]   05/29/2024   The [facility]BCI Quality Team Specialist reviewed this patient medical record for the purposes of chart review for care gap closure. The following were reviewed: chart review for care gap closure-diabetic eye exam.    VBCI Quality Team

## 2024-05-31 NOTE — Telephone Encounter (Signed)
 Patient seen by Rosaline RAMAN NP 9/8

## 2024-06-07 ENCOUNTER — Telehealth (HOSPITAL_BASED_OUTPATIENT_CLINIC_OR_DEPARTMENT_OTHER): Payer: Self-pay | Admitting: *Deleted

## 2024-06-07 ENCOUNTER — Ambulatory Visit (INDEPENDENT_AMBULATORY_CARE_PROVIDER_SITE_OTHER)

## 2024-06-07 DIAGNOSIS — I5020 Unspecified systolic (congestive) heart failure: Secondary | ICD-10-CM

## 2024-06-07 LAB — ECHOCARDIOGRAM COMPLETE
Area-P 1/2: 4.21 cm2
S' Lateral: 2.44 cm

## 2024-06-07 NOTE — Telephone Encounter (Signed)
 PT dropped off BP readings. Will place in KPN book for Jeffrey Stanton to review on Monday.

## 2024-06-11 ENCOUNTER — Telehealth: Payer: Self-pay | Admitting: Nurse Practitioner

## 2024-06-11 NOTE — Telephone Encounter (Signed)
 Blood pressure readings are overall well-controlled and heart rate is well-controlled.  Continue current medications and schedule follow-up visit for March.  Let us  know if he has any concerning symptoms prior to that time.

## 2024-06-11 NOTE — Telephone Encounter (Signed)
 S/w pt gave both results and recommendations. Pt stated is taking nitroglycerin  weekly. Stated will call back pt with Michelle's recommendations. Scheduled f/u in March with Dr. Raford per recall.

## 2024-06-12 NOTE — Telephone Encounter (Signed)
 We had previously recommended coronary CTA due to suspicion for ischemia. Will he reconsider CT. We also could consider soon appointment to discuss management of angina.

## 2024-06-12 NOTE — Telephone Encounter (Signed)
 Spoke with patient and walking up and down back steps causes him to have chest pain yet he is supposed to exercise Patient was to have cardiac CT however his copay is so high he can not afford, still paying on CT he had last year  Offered appointment however he doesn't feel like that would do any good  Did advise to go to ED if chest pains return and do not resolve   Will forward to [place] for review

## 2024-06-13 MED ORDER — ISOSORBIDE MONONITRATE ER 30 MG PO TB24: 30.0000 mg | ORAL_TABLET | Freq: Every day | ORAL | 11 refills | Status: AC

## 2024-06-13 NOTE — Telephone Encounter (Signed)
 S/w pt is willing to try Imdur ( 30 mg). Sent in # 30 to requested pharmacy.  Gave pt Erectile Dysfunction precautions. Scheduled pt for 6 week f/u.

## 2024-06-13 NOTE — Telephone Encounter (Signed)
 We can add Imdur 30 mg daily for chest pain. He will have to avoid taking phosphodiesterase type 5 medications like sildenafil and tadalafil within 48 hours of taking Imdur. Would want to see him in follow-up in 4-6 weeks.

## 2024-06-25 ENCOUNTER — Telehealth: Payer: Self-pay | Admitting: Cardiovascular Disease

## 2024-06-25 NOTE — Telephone Encounter (Signed)
 Pt c/o medication issue:  1. Name of Medication: isosorbide mononitrate (IMDUR) 30 MG 24 hr tablet   2. How are you currently taking this medication (dosage and times per day)? Pt is taking at Night   3. Are you having a reaction (difficulty breathing--STAT)? NA   4. What is your medication issue? Pt just wanted to know if he can switch and take this med in the morning now.  It is not causing him any headaches   Best number [phone]

## 2024-06-25 NOTE — Telephone Encounter (Signed)
 Spoke with patient who stated that he takes Isosorbide and has some chest pain during the day around 3 pm.  Advised to change to taking in am and call back if continues to have pain

## 2024-07-02 NOTE — Progress Notes (Signed)
 Jeffrey Stanton                                          MRN: [id number]   07/02/2024   The VBCI Quality Team Specialist reviewed this patient medical record for the purposes of chart review for care gap closure. The following were reviewed: chart review for care gap closure-kidney health evaluation for diabetes:eGFR  and uACR.    VBCI Quality Team

## 2024-07-09 ENCOUNTER — Other Ambulatory Visit: Payer: Self-pay | Admitting: Family Medicine

## 2024-07-09 DIAGNOSIS — E1142 Type 2 diabetes mellitus with diabetic polyneuropathy: Secondary | ICD-10-CM

## 2024-07-10 ENCOUNTER — Other Ambulatory Visit: Payer: Self-pay

## 2024-07-10 DIAGNOSIS — E785 Hyperlipidemia, unspecified: Secondary | ICD-10-CM

## 2024-07-10 MED ORDER — ROSUVASTATIN CALCIUM 20 MG PO TABS: 20.0000 mg | ORAL_TABLET | Freq: Every day | ORAL | 3 refills | Status: AC
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
## 2024-07-13 ENCOUNTER — Other Ambulatory Visit: Payer: Self-pay | Admitting: Family Medicine

## 2024-07-13 DIAGNOSIS — E1142 Type 2 diabetes mellitus with diabetic polyneuropathy: Secondary | ICD-10-CM

## 2024-07-16 ENCOUNTER — Ambulatory Visit (HOSPITAL_BASED_OUTPATIENT_CLINIC_OR_DEPARTMENT_OTHER): Admitting: Nurse Practitioner

## 2024-07-16 ENCOUNTER — Telehealth: Payer: Self-pay | Admitting: Licensed Clinical Social Worker

## 2024-07-16 ENCOUNTER — Encounter (HOSPITAL_BASED_OUTPATIENT_CLINIC_OR_DEPARTMENT_OTHER): Payer: Self-pay | Admitting: Nurse Practitioner

## 2024-07-16 VITALS — BP 134/72 | HR 74 | Ht 70.0 in | Wt 190.0 lb

## 2024-07-16 DIAGNOSIS — I5020 Unspecified systolic (congestive) heart failure: Secondary | ICD-10-CM

## 2024-07-16 DIAGNOSIS — E785 Hyperlipidemia, unspecified: Secondary | ICD-10-CM | POA: Diagnosis not present

## 2024-07-16 DIAGNOSIS — I10 Essential (primary) hypertension: Secondary | ICD-10-CM | POA: Diagnosis not present

## 2024-07-16 DIAGNOSIS — R9430 Abnormal result of cardiovascular function study, unspecified: Secondary | ICD-10-CM

## 2024-07-16 DIAGNOSIS — Z5941 Food insecurity: Secondary | ICD-10-CM | POA: Diagnosis not present

## 2024-07-16 DIAGNOSIS — Z79899 Other long term (current) drug therapy: Secondary | ICD-10-CM | POA: Diagnosis not present

## 2024-07-16 NOTE — Patient Instructions (Signed)
 Medication Instructions:   Your physician recommends that you continue on your current medications as directed. Please refer to the Current Medication list given to you today.   *If you need a refill on your cardiac medications before your next appointment, please call your pharmacy*  Lab Work:  None ordered.  If you have labs (blood work) drawn today and your tests are completely normal, you will receive your results only by: MyChart Message (if you have MyChart) OR A paper copy in the mail If you have any lab test that is abnormal or we need to change your treatment, we will call you to review the results.  Testing/Procedures:  None ordered.  Follow-Up: At [facility], you and your health needs are our priority.  As part of our continuing mission to provide you with exceptional heart care, our providers are all part of one team.  This team includes your primary Cardiologist (physician) and Advanced Practice Providers or APPs (Physician Assistants and Nurse Practitioners) who all work together to provide you with the care you need, when you need it.  Your next appointment:   3 month(s)  Provider:   Annabella Scarce, MD    We recommend signing up for the patient portal called MyChart.  Sign up information is provided on this After Visit Summary.  MyChart is used to connect with patients for Virtual Visits (Telemedicine).  Patients are able to view lab/test results, encounter notes, upcoming appointments, etc.  Non-urgent messages can be sent to your provider as well.   To learn more about what you can do with MyChart, go to [email or web address].
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
## 2024-07-23 ENCOUNTER — Telehealth: Payer: Self-pay | Admitting: Pharmacist

## 2024-07-23 NOTE — Telephone Encounter (Addendum)
 Patient contacted for follow-up of DM QI'25. Reach out to schedule patient with PCP for UACR and repeated A1C.  Patient complained of frequent urination starting last week, which he goes to bathroom ~20 times/day, home BG ~140-160s. Reported he is drinking some juices but opted for sugar-free options. Counseled patient to continue checking home blood sugar.   Appointment scheduled for 08/08/2024 at 10:10 AM  Total time with patient call and documentation of interaction: 7 minutes.

## 2024-08-08 ENCOUNTER — Ambulatory Visit: Admitting: Family Medicine

## 2024-08-08 ENCOUNTER — Encounter: Payer: Self-pay | Admitting: Family Medicine

## 2024-08-08 VITALS — BP 150/82 | HR 84 | Ht 70.0 in | Wt 190.4 lb

## 2024-08-08 DIAGNOSIS — R350 Frequency of micturition: Secondary | ICD-10-CM | POA: Diagnosis not present

## 2024-08-08 DIAGNOSIS — I1A0 Resistant hypertension: Secondary | ICD-10-CM | POA: Diagnosis not present

## 2024-08-08 DIAGNOSIS — N401 Enlarged prostate with lower urinary tract symptoms: Secondary | ICD-10-CM | POA: Diagnosis not present

## 2024-08-08 DIAGNOSIS — E1142 Type 2 diabetes mellitus with diabetic polyneuropathy: Secondary | ICD-10-CM

## 2024-08-08 LAB — POCT GLYCOSYLATED HEMOGLOBIN (HGB A1C): HbA1c, POC (controlled diabetic range): 6.6 % (ref 0.0–7.0)

## 2024-08-08 MED ORDER — TAMSULOSIN HCL 0.4 MG PO CAPS: 0.4000 mg | ORAL_CAPSULE | Freq: Every day | ORAL | 3 refills | Status: AC

## 2024-08-08 NOTE — Assessment & Plan Note (Signed)
 Ai1c improved to 6.6 from 7.5 - Continue Rybelsus  - Continue Jardiance 

## 2024-08-08 NOTE — Assessment & Plan Note (Signed)
 Mildly elevated today on recheck.  Consistent with at home measurements. - Continue current medication regimen including spironolactone , Entresto , Imdur , Lopressor  - Follow-up on blood pressure with cardiology appointment in January - May reduce to goal with initiation of Flomax

## 2024-08-08 NOTE — Progress Notes (Signed)
    SUBJECTIVE:   CHIEF COMPLAINT / HPI: urinary frequency, diabetes f/u  Discussed the use of AI scribe software for clinical note transcription with the patient, who gave verbal consent to proceed.  History of Present Illness Jeffrey Stanton is a 68 year old male with a history of overactive bladder who presents with increased urinary frequency and urgency.  Lower urinary tract symptoms - Increased urinary frequency and urgency, progressively worsening over the past few months - Persistent urge to urinate even after voiding - Sensation of incomplete bladder emptying, requiring frequent returns to the toilet - Episodes of strong urge without the ability to void - Occasional weak urinary stream - Nocturia, frequently disrupting sleep - Occasional episodes of urge incontinence, unable to reach the bathroom in time - No significant pain with urination, but mild, brief discomfort in the bladder area  Diabetes mellitus - Diabetes managed with Jardiance  and Rybelsus  - Recent hemoglobin A1c of 6.6  Hypertension and edema - Hypertension treated with Inder, Lopressor , Entresto , and spironolactone  - Torsemide  used as needed for swelling, but not used recently due to diuretic effect    PERTINENT  PMH / PSH: T2DM, Overactive bladder, HFrEF  OBJECTIVE:   BP (!) 150/82   Pulse 84   Ht 5' 10 (1.778 m)   Wt 190 lb 6 oz (86.4 kg)   SpO2 100%   BMI 27.32 kg/m   Physical Exam General: NAD, well appearing Neuro: A&O Cardiovascular: RRR, no murmurs,  Respiratory: normal WOB on RA, CTAB, no wheezes, ronchi or rales Abdomen: soft, NTTP, no rebound or guarding Extremities: Moving all 4 extremities equally, no peripheral edema   ASSESSMENT/PLAN:   Assessment & Plan Type 2 diabetes mellitus with diabetic polyneuropathy, without long-term current use of insulin (HCC) Ai1c improved to 6.6 from 7.5 - Continue Rybelsus  - Continue Jardiance  Benign prostatic hyperplasia with urinary  frequency LUTS symptoms worsening over the past several months. - Start 0.4 mg daily Flomax - Follow-up UA, urine culture Resistant hypertension Mildly elevated today on recheck.  Consistent with at home measurements. - Continue current medication regimen including spironolactone , Entresto , Imdur , Lopressor  - Follow-up on blood pressure with cardiology appointment in January - May reduce to goal with initiation of Flomax  Precepted with Dr. McDiarmid  Return in about 1 month (around 09/08/2024).  Ozell Provencal, MD, PGY-3 [facility] 12:31 PM 08/08/2024  [facility]

## 2024-08-08 NOTE — Patient Instructions (Signed)
 It was great to see you! Thank you for allowing me to participate in your care!  Our plans for today:   VISIT SUMMARY: You visited today due to increased urinary frequency and urgency. We discussed your symptoms and made some adjustments to your medications. Your diabetes is well-controlled, and we will continue with your current management plan for hypertension.  YOUR PLAN: BENIGN PROSTATIC HYPERPLASIA WITH LOWER URINARY TRACT SYMPTOMS: Your urinary symptoms are likely due to an enlarged prostate. -We ordered a urine analysis and culture to rule out infection. -We prescribed Flomax to help with your symptoms. Be aware of potential side effects like dizziness when standing up quickly.  TYPE 2 DIABETES MELLITUS WITH DIABETIC POLYNEUROPATHY: Your diabetes is well-controlled with your current medications. -Continue taking Jardiance  and Rybelsus  as prescribed.  RESISTANT HYPERTENSION: Your blood pressure readings at home are slightly elevated. -Continue your current blood pressure medications. -Monitor your blood pressure at home regularly.    Please arrive 15 minutes PRIOR to your next scheduled appointment time! If you do not, this affects OTHER patients' care.  Take care and seek immediate care sooner if you develop any concerns.   Ozell Provencal, MD, PGY-3 [facility] 10:56 AM 08/08/2024  [facility]

## 2024-08-09 LAB — MICROALBUMIN / CREATININE URINE RATIO
Creatinine, Urine: 109.1 mg/dL
Microalb/Creat Ratio: 370 mg/g{creat} — ABNORMAL HIGH (ref 0–29)
Microalbumin, Urine: 403.4 ug/mL

## 2024-08-10 ENCOUNTER — Other Ambulatory Visit: Payer: Self-pay

## 2024-08-10 LAB — URINE CULTURE: Organism ID, Bacteria: NO GROWTH

## 2024-08-10 MED ORDER — ACCU-CHEK GUIDE TEST VI STRP: ORAL_STRIP | 0 refills | Status: DC

## 2024-08-11 ENCOUNTER — Other Ambulatory Visit: Payer: Self-pay | Admitting: Family Medicine

## 2024-08-13 ENCOUNTER — Ambulatory Visit: Payer: Self-pay | Admitting: Family Medicine

## 2024-08-13 NOTE — Telephone Encounter (Signed)
 Called patient to discuss elevated urine microalbumin to creatinine ratio. Discussed that this likely represents constellation of disease (HTN, HFrEF, T2DM) impact on kidneys. Reassuringly BMP in September had normal creatinine and eGFR. Patient is on maximum renal support at this time (Jardiance , Entresto , Spironolactone ). A1c controlled. Will work on better BP control with cardiology. Will repeat BMP at next follow-up.

## 2024-08-22 ENCOUNTER — Other Ambulatory Visit: Payer: Self-pay | Admitting: Family Medicine

## 2024-08-22 DIAGNOSIS — E1142 Type 2 diabetes mellitus with diabetic polyneuropathy: Secondary | ICD-10-CM

## 2024-08-28 ENCOUNTER — Telehealth: Payer: Self-pay

## 2024-08-28 NOTE — Telephone Encounter (Signed)
 Patient contacted.   Advised to stop Jardiance .  Apt scheduled for tomorrow morning for evaluation.

## 2024-08-28 NOTE — Telephone Encounter (Signed)
 Patient calls nurse line in regards to Jardiance .   He reports he has noticed more genital irritation with the resumption of Jardiance . He reports he has been off and on this medication for years.   He reports he has noticed slight irritation for the past several months, however over this past weekend symptoms worsened.   He reports very dry itchy skin on his penis and behind his scrotum. He reports pain, however feels more like a burning raw pain.  He denies any discharge. No fevers or chills. No swelling or warmth. No dysuria.   He has not tried anything for relief.   Apt offered for tomorrow, however declined at this time.   Will forward to PCP.

## 2024-08-29 ENCOUNTER — Encounter: Payer: Self-pay | Admitting: Family Medicine

## 2024-08-29 ENCOUNTER — Ambulatory Visit: Payer: Self-pay | Admitting: Family Medicine

## 2024-08-29 VITALS — BP 150/90 | HR 90 | Ht 70.0 in | Wt 193.2 lb

## 2024-08-29 DIAGNOSIS — R8271 Bacteriuria: Secondary | ICD-10-CM

## 2024-08-29 DIAGNOSIS — L989 Disorder of the skin and subcutaneous tissue, unspecified: Secondary | ICD-10-CM

## 2024-08-29 DIAGNOSIS — N5089 Other specified disorders of the male genital organs: Secondary | ICD-10-CM | POA: Diagnosis not present

## 2024-08-29 DIAGNOSIS — R809 Proteinuria, unspecified: Secondary | ICD-10-CM

## 2024-08-29 LAB — POCT URINALYSIS DIP (MANUAL ENTRY)
Bilirubin, UA: NEGATIVE
Blood, UA: NEGATIVE
Glucose, UA: NEGATIVE mg/dL
Ketones, POC UA: NEGATIVE mg/dL
Leukocytes, UA: NEGATIVE
Nitrite, UA: NEGATIVE
Protein Ur, POC: >=300 mg/dL — AB
Spec Grav, UA: >=1.03 — AB
Urobilinogen, UA: 0.2 U/dL
pH, UA: 5.5

## 2024-08-29 LAB — POCT UA - MICROSCOPIC ONLY

## 2024-08-29 MED ORDER — FLUCONAZOLE 150 MG PO TABS: 150.0000 mg | ORAL_TABLET | Freq: Once | ORAL | 0 refills | Status: AC

## 2024-08-29 NOTE — Progress Notes (Signed)
" ° ° °  SUBJECTIVE:   CHIEF COMPLAINT / HPI:   Genital irritation On jardiance  Reports dry itchy skin on penis and behind scrotum, burning raw pain. Discomfort ongoing x several months but worsening over this past weekend His jardiance  was stopped 12/23 and he was advised to come in for eval  He denies any significant skin discoloration Denies any abnormal penile discharge.  Does not have any pain around his foreskin He denies sexual activity or concern for STDs, declines STD testing Denies testicular pain.  Primarily just has some burning pain around his urethral orifice but no dysuria or hematuria with urination.  Denies flank pain, fevers   PERTINENT  PMH / PSH: Hx CHF EF 35-40%  OBJECTIVE:   BP (!) 147/90   Pulse 90   Ht 5' 10 (1.778 m)   Wt 193 lb 4 oz (87.7 kg)   SpO2 100%   BMI 27.73 kg/m    General: NAD, pleasant, able to participate in exam Respiratory: No respiratory distress ABD: Soft NTND Skin: warm and dry, no rashes noted Psych: Normal affect and mood  GU: External genitalia appear normal.  Uncircumcised, no evidence of phimosis or paraphimosis.  Foreskin is mobile without underlying irritation.  No discharge noted.  No significant edema or skin discoloration noted.  The area is not tender.  ASSESSMENT/PLAN:    Assessment & Plan Dermatosis of external genitalia Pain of male genitalia No evidence of severe skin disease or necrotizing fasciitis or yeast infection.  Obtained UA which showed proteinuria and bacteriuria but negative for leukocytes and nitrites.  Given significant proteinuria curious if this could also represent yeast. Feel it is reasonable to treat for candida with Diflucan  - sent Rx and discussed w patient. Ordered urine culture.  after shared decision-making we decided to stay off of the Jardiance  for a few days to see if his symptoms completely improve.  If they do not, I recommended that he restart the Jardiance  as the benefits of this  medication would outweigh superficial skin irritation.  His A1c is improved and his EF was also slightly improved but I think he would still benefit from remaining on the Jardiance .  We discussed signs and symptoms of things like yeast infection, UTI, and necrotizing fasciitis although this is more rare. Advised patient to let us  know how he is feeling after a few days especially if he decides to stop the Jardiance  altogether Discussed ED/return precautions  His BP is elevated today.  He is asymptomatic.  He endorses taking all of his medications as prescribed.  Discussed that Jardiance  can also help with blood pressure.  He plans to keep a log of his BP and follow-up as scheduled in January.  Discussed return precautions.  Payton Coward, MD [facility] "

## 2024-08-29 NOTE — Patient Instructions (Signed)
 I think it is reasonable to stay off of your Jardiance  for a few days to see if your symptoms completely resolve.  If they do not, I would recommend restarting this as this medication is still beneficial for you to take regarding your heart failure and diabetes.  Please let us  know how you are doing after a few days.  If you develop any worsening pain or change in the skin color of your genital region or have worsening discharge, bleeding, swelling, testicular pain, tenderness in the region I recommend urgent evaluation  If any of your results from today are abnormal and/or require changes to your medical care, I will give you a call. Otherwise, I will send you a letter in the mail or a message on MyChart.

## 2024-08-31 ENCOUNTER — Ambulatory Visit: Payer: Self-pay | Admitting: Family Medicine

## 2024-08-31 ENCOUNTER — Other Ambulatory Visit: Payer: Self-pay | Admitting: Family Medicine

## 2024-08-31 DIAGNOSIS — I5020 Unspecified systolic (congestive) heart failure: Secondary | ICD-10-CM

## 2024-08-31 LAB — URINE CULTURE

## 2024-09-12 ENCOUNTER — Other Ambulatory Visit: Payer: Self-pay | Admitting: Family Medicine

## 2024-09-12 DIAGNOSIS — I1A0 Resistant hypertension: Secondary | ICD-10-CM

## 2024-09-20 ENCOUNTER — Ambulatory Visit (INDEPENDENT_AMBULATORY_CARE_PROVIDER_SITE_OTHER)

## 2024-09-20 ENCOUNTER — Ambulatory Visit

## 2024-09-20 VITALS — BP 142/72 | HR 88 | Ht 69.5 in | Wt 195.0 lb

## 2024-09-20 VITALS — BP 190/90 | HR 88 | Temp 97.5°F | Ht 69.5 in | Wt 195.4 lb

## 2024-09-20 DIAGNOSIS — E1142 Type 2 diabetes mellitus with diabetic polyneuropathy: Secondary | ICD-10-CM | POA: Diagnosis not present

## 2024-09-20 DIAGNOSIS — I1A0 Resistant hypertension: Secondary | ICD-10-CM

## 2024-09-20 DIAGNOSIS — Z0000 Encounter for general adult medical examination without abnormal findings: Secondary | ICD-10-CM | POA: Diagnosis not present

## 2024-09-20 NOTE — Assessment & Plan Note (Addendum)
 Patient not currently taking Jardiance  given prior genital pain with this medication and does not want to restart this.  Last A1c 08/08/2024 well-controlled at 6.6, recheck not indicated at this time. - Continue to hold Jardiance  - Continue semaglutide  7 mg daily - Appointment made 11/20/2024 with PCP; can check A1c at that time and determine need for diabetic medication adjustments

## 2024-09-20 NOTE — Assessment & Plan Note (Addendum)
 Initially very elevated BP today which improved to 147/72 on recheck.  On evaluation of patient's home readings, readings largely in 120s-130s/70s-80s.  Given overall well-controlled home readings along with patient's report of intermittent dizziness related to postural changes, do not want to make medication adjustments at this time. - Continue Lopressor  50 mg twice daily, Entresto  97-103 mg twice daily, spironolactone  25 mg daily - Continue to keep a log of home Bps - Patient has upcoming appointment with cardiologist on 11/01/24, can recheck blood pressure then

## 2024-09-20 NOTE — Progress Notes (Signed)
 "  Chief Complaint  Patient presents with   Medicare Wellness    Subsequent     Subjective:   Jeffrey Stanton is a 69 y.o. male who presents for a Medicare Annual Wellness Visit.  Visit info / Clinical Intake: Medicare Wellness Visit Type:: Subsequent Annual Wellness Visit Persons participating in visit and providing information:: patient Medicare Wellness Visit Mode:: In-person (required for WTM) Interpreter Needed?: No Pre-visit prep was completed: yes AWV questionnaire completed by patient prior to visit?: no Living arrangements:: (!) lives alone Patient's Overall Health Status Rating: (!) fair Typical amount of pain: some Does pain affect daily life?: (!) yes Are you currently prescribed opioids?: no  Dietary Habits and Nutritional Risks How many meals a day?: 2 Eats fruit and vegetables daily?: yes Most meals are obtained by: preparing own meals In the last 2 weeks, have you had any of the following?: none Diabetic:: (!) yes Any non-healing wounds?: no How often do you check your BS?: 1 Would you like to be referred to a Nutritionist or for Diabetic Management? : no  Functional Status Activities of Daily Living (to include ambulation/medication): Independent Ambulation: Independent with device- listed below Home Assistive Devices/Equipment: Johna Finder (specify Type) Medication Administration: Independent Home Management (perform basic housework or laundry): Independent Manage your own finances?: yes Primary transportation is: driving Concerns about vision?: no *vision screening is required for WTM* Concerns about hearing?: no  Fall Screening Falls in the past year?: 1 Number of falls in past year: 1 Was there an injury with Fall?: 1 Fall Risk Category Calculator: 3 Patient Fall Risk Level: High Fall Risk  Fall Risk Patient at Risk for Falls Due to: History of fall(s) Fall risk Follow up: Falls evaluation completed; Education provided  Home and  Transportation Safety: All rugs have non-skid backing?: N/A, no rugs All stairs or steps have railings?: yes (Ramp) Grab bars in the bathtub or shower?: yes Have non-skid surface in bathtub or shower?: yes Good home lighting?: yes Regular seat belt use?: yes Hospital stays in the last year:: no  Cognitive Assessment Difficulty concentrating, remembering, or making decisions? : yes Will 6CIT or Mini Cog be Completed: yes  Advance Directives (For Healthcare) Does Patient Have a Medical Advance Directive?: No Would patient like information on creating a medical advance directive?: No - Patient declined  Reviewed/Updated  Reviewed/Updated: Reviewed All (Medical, Surgical, Family, Medications, Allergies, Care Teams, Patient Goals)    Allergies (verified) Metformin  and related   Current Medications (verified) Outpatient Encounter Medications as of 09/20/2024  Medication Sig   acetaminophen (TYLENOL) 500 MG tablet Take 1,000 mg by mouth every 6 (six) hours as needed (pain). Usually takes 1000 mg bid   aspirin  81 MG tablet Take 1 tablet (81 mg total) by mouth daily.   blood glucose meter kit and supplies KIT Dispense based on patient and insurance preference. Use up to four times daily as directed. (FOR ICD-10 E 11.8).   Cyanocobalamin  (VITAMIN B 12 PO) Take 1 tablet by mouth daily. (Patient not taking: Reported on 07/16/2024)   empagliflozin  (JARDIANCE ) 10 MG TABS tablet Take 1 tablet (10 mg total) by mouth daily. (Patient not taking: Reported on 09/20/2024)   glucose blood (ACCU-CHEK GUIDE TEST) test strip Check daily in the morning when you wake up prior to eating.   Iron , Ferrous Sulfate , 325 (65 Fe) MG TABS Take 325 mg by mouth daily.   isosorbide  mononitrate (IMDUR ) 30 MG 24 hr tablet Take 1 tablet (30 mg total) by  mouth daily.   metoprolol  tartrate (LOPRESSOR ) 50 MG tablet TAKE 1 TABLET BY MOUTH 2 TIMES A DAY   MICROLET LANCETS MISC 1 each by Does not apply route daily.    nitroGLYCERIN  (NITROSTAT ) 0.4 MG SL tablet Place 1 tablet (0.4 mg total) under the tongue every 5 (five) minutes as needed for chest pain.   Omega-3 Fatty Acids (FISH OIL CONCENTRATE PO) Take 1 capsule by mouth daily.   potassium chloride  SA (KLOR-CON  M) 20 MEQ tablet Take 1 tablet (20 mEq total) by mouth as needed (on days when you take Toresemide).   rosuvastatin  (CRESTOR ) 20 MG tablet Take 1 tablet (20 mg total) by mouth daily.   sacubitril-valsartan (ENTRESTO ) 97-103 MG Take 1 tablet by mouth 2 (two) times daily.   Semaglutide  (RYBELSUS ) 7 MG TABS TAKE 1 TABLET BY MOUTH FIRST THING IN THE MORNING ON AN EMPTY STOMACH   spironolactone  (ALDACTONE ) 25 MG tablet TAKE 1 TABLET BY MOUTH DAILY   tamsulosin  (FLOMAX ) 0.4 MG CAPS capsule Take 1 capsule (0.4 mg total) by mouth daily.   torsemide  (DEMADEX ) 20 MG tablet Take 20 mg by mouth daily as needed (swelling). Hasn't taken in a month   VITAMIN D  PO Take 1 capsule by mouth every other day.   vitamin E 180 MG (400 UNITS) capsule Take 400 Units by mouth daily.   No facility-administered encounter medications on file as of 09/20/2024.    History: Past Medical History:  Diagnosis Date   DIABETES MELLITUS, TYPE II 11/27/2009   HYPERCHOLESTEROLEMIA 11/27/2009   HYPERTENSION 11/27/2009   Past Surgical History:  Procedure Laterality Date   HERNIA REPAIR  1963   ROTATOR CUFF REPAIR  1993   left side Beverley Economy   Family History  Problem Relation Age of Onset   Heart failure Mother    Heart attack Mother    Diabetes Mother    Hypertension Mother    Heart attack Brother 57       11 stents   Hypertension Brother    Social History   Occupational History   Not on file  Tobacco Use   Smoking status: Never    Passive exposure: Past   Smokeless tobacco: Never  Vaping Use   Vaping status: Never Used  Substance and Sexual Activity   Alcohol use: Not on file   Drug use: Not on file   Sexual activity: Not on file   Tobacco  Counseling Counseling given: Not Answered  SDOH Screenings   Food Insecurity: Food Insecurity Present (09/20/2024)  Housing: Low Risk (09/20/2024)  Transportation Needs: No Transportation Needs (09/20/2024)  Utilities: Not At Risk (09/20/2024)  Alcohol Screen: Low Risk (09/20/2024)  Depression (PHQ2-9): Low Risk (09/20/2024)  Financial Resource Strain: Low Risk (09/20/2024)  Physical Activity: Sufficiently Active (09/20/2024)  Social Connections: Socially Isolated (09/20/2024)  Stress: No Stress Concern Present (09/20/2024)  Tobacco Use: Low Risk (09/20/2024)  Health Literacy: Adequate Health Literacy (09/20/2024)   See flowsheets for full screening details  Depression Screen Depression Screening Exception Documentation Depression Screening Exception:: Patient refusal  PHQ 2 & 9 Depression Scale- Over the past 2 weeks, how often have you been bothered by any of the following problems? Little interest or pleasure in doing things: 0 Feeling down, depressed, or hopeless (PHQ Adolescent also includes...irritable): 0 PHQ-2 Total Score: 0 Trouble falling or staying asleep, or sleeping too much: 0 Feeling tired or having little energy: 0 Poor appetite or overeating (PHQ Adolescent also includes...weight loss): 0 Feeling bad about yourself -  or that you are a failure or have let yourself or your family down: 0 Trouble concentrating on things, such as reading the newspaper or watching television (PHQ Adolescent also includes...like school work): 0 Moving or speaking so slowly that other people could have noticed. Or the opposite - being so fidgety or restless that you have been moving around a lot more than usual: 0 Thoughts that you would be better off dead, or of hurting yourself in some way: 0 PHQ-9 Total Score: 0 If you checked off any problems, how difficult have these problems made it for you to do your work, take care of things at home, or get along with other people?: Not difficult at  all  Depression Treatment Depression Interventions/Treatment : EYV7-0 Score <4 Follow-up Not Indicated     Goals Addressed             This Visit's Progress    09/20/2024: Stay independent as possible.               Objective:    Today's Vitals   09/20/24 1024  BP: (!) 190/90  Pulse: 88  Temp: (!) 97.5 F (36.4 C)  TempSrc: Temporal  SpO2: 98%  Weight: 195 lb 6.4 oz (88.6 kg)  Height: 5' 9.5 (1.765 m)  PainSc: 4   PainLoc: Neck   Body mass index is 28.44 kg/m.  Hearing/Vision screen No results found. Immunizations and Health Maintenance Health Maintenance  Topic Date Due   OPHTHALMOLOGY EXAM  Never done   Zoster Vaccines- Shingrix (1 of 2) Never done   DTaP/Tdap/Td (2 - Td or Tdap) 07/25/2021   Colonoscopy  07/25/2021   COVID-19 Vaccine (4 - 2025-26 season) 05/07/2024   FOOT EXAM  10/09/2024   HEMOGLOBIN A1C  02/06/2025   Diabetic kidney evaluation - eGFR measurement  05/16/2025   Diabetic kidney evaluation - Urine ACR  08/08/2025   Medicare Annual Wellness (AWV)  09/20/2025   Pneumococcal Vaccine: 50+ Years  Completed   Hepatitis C Screening  Completed   Meningococcal B Vaccine  Aged Out   Influenza Vaccine  Discontinued        Assessment/Plan:  This is a routine wellness examination for Daren.  Patient Care Team: Alba Sharper, MD as PCP - General (Family Medicine) Raford Riggs, MD as Attending Physician (Cardiology) Michele Richardson, DO as Consulting Physician (Cardiology)  I have personally reviewed and noted the following in the patients chart:   Medical and social history Use of alcohol, tobacco or illicit drugs  Current medications and supplements including opioid prescriptions. Functional ability and status Nutritional status Physical activity Advanced directives List of other physicians Hospitalizations, surgeries, and ER visits in previous 12 months Vitals Screenings to include cognitive, depression, and falls Referrals  and appointments  No orders of the defined types were placed in this encounter.  In addition, I have reviewed and discussed with patient certain preventive protocols, quality metrics, and best practice recommendations. A written personalized care plan for preventive services as well as general preventive health recommendations were provided to patient.   Roz LOISE Fuller, LPN   8/84/7973   Return in about 1 year (around 09/20/2025) for Medicare wellness.  After Visit Summary: (Declined) Due to this being a telephonic visit, with patients personalized plan was offered to patient but patient Declined AVS at this time   Nurse Notes: Patient's bp was 200/90, rechecked 190/90.  Patient was added to schedule and wil be seen today by a physician. HM Addressed: Vaccines Due:  Dtap, Shingrix Patient overdue for: Diabetic Eye Exam, Colonoscopy.   "

## 2024-09-20 NOTE — Patient Instructions (Signed)
 Mr. Bedoya,  Thank you for taking the time for your Medicare Wellness Visit. I appreciate your continued commitment to your health goals. Please review the care plan we discussed, and feel free to reach out if I can assist you further.  Please note that Annual Wellness Visits do not include a physical exam. Some assessments may be limited, especially if the visit was conducted virtually. If needed, we may recommend an in-person follow-up with your provider.  Ongoing Care Seeing your primary care provider every 3 to 6 months helps us  monitor your health and provide consistent, personalized care.   Referrals If a referral was made during today's visit and you haven't received any updates within two weeks, please contact the referred provider directly to check on the status.  Recommended Screenings:  Health Maintenance  Topic Date Due   Medicare Annual Wellness Visit  Never done   Eye exam for diabetics  Never done   Zoster (Shingles) Vaccine (1 of 2) Never done   DTaP/Tdap/Td vaccine (2 - Td or Tdap) 07/25/2021   Colon Cancer Screening  07/25/2021   COVID-19 Vaccine (4 - 2025-26 season) 05/07/2024   Complete foot exam   10/09/2024   Hemoglobin A1C  02/06/2025   Yearly kidney function blood test for diabetes  05/16/2025   Yearly kidney health urinalysis for diabetes  08/08/2025   Pneumococcal Vaccine for age over 23  Completed   Hepatitis C Screening  Completed   Meningitis B Vaccine  Aged Out   Flu Shot  Discontinued       09/20/2024   10:30 AM  Advanced Directives  Would patient like information on creating a medical advance directive? No - Patient declined    Vision: Annual vision screenings are recommended for early detection of glaucoma, cataracts, and diabetic retinopathy. These exams can also reveal signs of chronic conditions such as diabetes and high blood pressure.  Dental: Annual dental screenings help detect early signs of oral cancer, gum disease, and other conditions  linked to overall health, including heart disease and diabetes.  Please see the attached documents for additional preventive care recommendations.

## 2024-09-20 NOTE — Patient Instructions (Signed)
 Jeffrey Stanton,   It was great seeing you in clinic today! You came in for high blood pressure. This blood pressure did improve on recheck, and your home log overall looks good. Given this, and given your occasional episodes of dizziness, I do not want to make changes to your blood pressure medications at this time. Please continue to take these as prescribed, and continue to log your blood pressures at home.  I think it is fine to stay off Jardiance  for now. We will check your A1c (sugar level) when you come back in March, and might need to make changes to your medications depending on that recheck.  Thank you for allowing me to be a part of your care team! Alan Flies, MD [facility] [address] [place], [place], [place] [address] [phone]

## 2024-09-20 NOTE — Progress Notes (Signed)
" ° ° °  SUBJECTIVE:   CHIEF COMPLAINT / HPI:   Jeffrey Stanton is a 69 y.o. male presenting for high BP found at an annual wellness visit earlier today.   Hypertension BP found to be 190/90 on repeat check this morning during annual wellness visit, and so patient's visit was transformed into office visit. Reports no new symptoms today other than some dizzy spells over the past week. Did have one episode of falling after dizziness, no head injury, able to catch himself. Some related to changing positions (sitting to standing), sometimes just during walking. No loss of consciousness. Has had at least one episode every day since Saturday. Reports he drinks lots of water. Denies changes in what foods he eats.  Occasional headaches, no more than usual. Does occasionally report blurred vision, needs to go to diabetic eye exam still. No blurry vision, headache today with high blood pressure.  Also didn't sleep well last night.  T2DM Has been off Jardiance  over past month after experiencing genital pain. Symptoms resolved since stopping Jardiance , does not want to restart.  PERTINENT  PMH / PSH: resistant HTN, HFrEF, T2DM, hypercholesterolemia, anemia  OBJECTIVE:   BP (!) 142/72   Pulse 88   Ht 5' 9.5 (1.765 m)   Wt 195 lb (88.5 kg)   SpO2 98%   BMI 28.38 kg/m    General: Patient sitting in chair, no acute distress Cardiology: Warm, well-perfused Respiratory: Normal work of breathing on room air Neuro: Alert, appropriately responding to questions, grossly intact neurological function    ASSESSMENT/PLAN:   Assessment & Plan Resistant hypertension Initially very elevated BP today which improved to 147/72 on recheck.  On evaluation of patient's home readings, readings largely in 120s-130s/70s-80s.  Given overall well-controlled home readings along with patient's report of intermittent dizziness related to postural changes, do not want to make medication adjustments at this time. - Continue  Lopressor  50 mg twice daily, Entresto  97-103 mg twice daily, spironolactone  25 mg daily - Continue to keep a log of home Bps - Patient has upcoming appointment with cardiologist on 11/01/24, can recheck blood pressure then Type 2 diabetes mellitus with diabetic polyneuropathy, without long-term current use of insulin (HCC) Patient not currently taking Jardiance  given prior genital pain with this medication and does not want to restart this.  Last A1c 08/08/2024 well-controlled at 6.6, recheck not indicated at this time. - Continue to hold Jardiance  - Continue semaglutide  7 mg daily - Appointment made 11/20/2024 with PCP; can check A1c at that time and determine need for diabetic medication adjustments    Alan Flies, MD [facility] "

## 2024-10-12 ENCOUNTER — Other Ambulatory Visit: Payer: Self-pay | Admitting: Family Medicine

## 2024-10-12 DIAGNOSIS — E1142 Type 2 diabetes mellitus with diabetic polyneuropathy: Secondary | ICD-10-CM

## 2024-11-01 ENCOUNTER — Ambulatory Visit (HOSPITAL_BASED_OUTPATIENT_CLINIC_OR_DEPARTMENT_OTHER): Admitting: Cardiovascular Disease

## 2024-11-02 ENCOUNTER — Ambulatory Visit (HOSPITAL_BASED_OUTPATIENT_CLINIC_OR_DEPARTMENT_OTHER): Admitting: Cardiovascular Disease

## 2024-11-20 ENCOUNTER — Ambulatory Visit: Payer: Self-pay | Admitting: Family Medicine
